# Patient Record
Sex: Male | Born: 1970
Health system: Southern US, Community
[De-identification: ages and names within clinical notes are randomized; demographics above are authoritative.]

## PROBLEM LIST (undated history)

## (undated) DIAGNOSIS — K219 Gastro-esophageal reflux disease without esophagitis: Secondary | ICD-10-CM

## (undated) DIAGNOSIS — M1990 Unspecified osteoarthritis, unspecified site: Secondary | ICD-10-CM

## (undated) DIAGNOSIS — E039 Hypothyroidism, unspecified: Secondary | ICD-10-CM

## (undated) HISTORY — DX: Gastro-esophageal reflux disease without esophagitis: K21.9

## (undated) HISTORY — PX: FOOT SURGERY: SHX648

---

## 2006-07-19 ENCOUNTER — Ambulatory Visit: Payer: Self-pay | Admitting: Gastroenterology

## 2006-07-19 LAB — CONVERTED CEMR LAB
Basophils Absolute: 0.1 10*3/uL (ref 0.0–0.1)
Basophils Relative: 0.7 % (ref 0.0–1.0)
Eosinophils Absolute: 0.2 10*3/uL (ref 0.0–0.6)
Eosinophils Relative: 2.4 % (ref 0.0–5.0)
HCT: 44.9 % (ref 39.0–52.0)
Hemoglobin: 15.5 g/dL (ref 13.0–17.0)
Lymphocytes Relative: 31.1 % (ref 12.0–46.0)
MCHC: 34.6 g/dL (ref 30.0–36.0)
MCV: 91.1 fL (ref 78.0–100.0)
Monocytes Absolute: 0.4 10*3/uL (ref 0.2–0.7)
Monocytes Relative: 5.5 % (ref 3.0–11.0)
Neutro Abs: 4.3 10*3/uL (ref 1.4–7.7)
Neutrophils Relative %: 60.3 % (ref 43.0–77.0)
Platelets: 211 10*3/uL (ref 150–400)
RBC: 4.93 M/uL (ref 4.22–5.81)
RDW: 12.3 % (ref 11.5–14.6)
WBC: 7.3 10*3/uL (ref 4.5–10.5)

## 2006-07-22 ENCOUNTER — Ambulatory Visit: Payer: Self-pay | Admitting: Gastroenterology

## 2015-01-10 ENCOUNTER — Telehealth: Payer: Self-pay

## 2015-01-10 NOTE — Telephone Encounter (Signed)
Incorrect chart

## 2016-02-24 ENCOUNTER — Other Ambulatory Visit: Payer: Self-pay | Admitting: Physical Medicine and Rehabilitation

## 2016-02-24 ENCOUNTER — Ambulatory Visit
Admission: RE | Admit: 2016-02-24 | Discharge: 2016-02-24 | Disposition: A | Discharge status: Home / Self Care | Payer: No Typology Code available for payment source | Source: Ambulatory Visit | Attending: Physical Medicine and Rehabilitation | Admitting: Physical Medicine and Rehabilitation

## 2016-02-24 DIAGNOSIS — Z0189 Encounter for other specified special examinations: Secondary | ICD-10-CM

## 2016-09-13 ENCOUNTER — Ambulatory Visit (INDEPENDENT_AMBULATORY_CARE_PROVIDER_SITE_OTHER): Payer: 59 | Admitting: Podiatry

## 2016-09-13 ENCOUNTER — Ambulatory Visit (INDEPENDENT_AMBULATORY_CARE_PROVIDER_SITE_OTHER): Payer: 59

## 2016-09-13 DIAGNOSIS — M722 Plantar fascial fibromatosis: Secondary | ICD-10-CM

## 2016-09-13 MED ORDER — MELOXICAM 15 MG PO TABS: 15.0000 mg | ORAL_TABLET | Freq: Every day | ORAL | 2 refills | Status: AC

## 2016-09-13 NOTE — Progress Notes (Signed)
Subjective:     Patient ID: Eduardo [name], male   DOB: [birth year], 46 y.o.   MRN: [id number]  HPI 46 year old male presents the also concerns of throbbing pain in the bottom of his right heel which is worse after sitting or sleeping or standing up. This has been ongoing for about 6 months. He said no recent treatment for this. He previously last summer had injections in the right foot for another issue without before the heel started to hurt. He's had no treatment for this particular issue. Pain does not wake him up at night. No numbness or tingling. No other complaints at this time.  Review of Systems  All other systems reviewed and are negative.      Objective:   Physical Exam General: AAO x3, NAD  Dermatological: Skin is warm, dry and supple bilateral. Nails x 10 are well manicured; remaining integument appears unremarkable at this time. There are no open sores, no preulcerative lesions, no rash or signs of infection present.  Vascular: Dorsalis Pedis artery and Posterior Tibial artery pedal pulses are 2/4 bilateral with immedate capillary fill time. Pedal hair growth present. There is no pain with calf compression, swelling, warmth, erythema.   Neruologic: Grossly intact via light touch bilateral. Vibratory intact via tuning fork bilateral. Protective threshold with Semmes Wienstein monofilament intact to all pedal sites bilateral.   Musculoskeletal: Tenderness to palpation along the plantar medial tubercle of the calcaneus at the insertion of plantar fascia on the right foot. There is no pain along the course of the plantar fascia within the arch of the foot. Plantar fascia appears to be intact. There is no pain with lateral compression of the calcaneus or pain with vibratory sensation. There is no pain along the course or insertion of the achilles tendon. No other areas of tenderness to bilateral lower extremities. Muscular strength 5/5 in all groups tested bilateral. Equinus is present.    Gait: Unassisted, Nonantalgic.      Assessment:     Right heel pain; plantar fasciitis     Plan:     -Treatment options discussed including all alternatives, risks, and complications -Etiology of symptoms were discussed -X-rays were obtained and reviewed with the patient. No evidence of acute fracture identified. -Patient elects to proceed with steroid injection into the right heel. Under sterile skin preparation, a total of 2.5cc of kenalog 10, 0.5% Marcaine plain, and 2% lidocaine plain were infiltrated into the symptomatic area without complication. A band-aid was applied. Patient tolerated the injection well without complication. Post-injection care with discussed with the patient. Discussed with the patient to ice the area over the next couple of days to help prevent a steroid flare.  -Night splint -Prescribed mobic. Discussed side effects of the medication and directed to stop if any are to occur and call the office.  -Stretching exercises daily. -Also recommended orthotics. He wishes to proceed with custom. Curiously had over-the-counter once for another issue which did not help and venous for her more. He was measured for orthotics and they were sent to [facility] labs. -RTC 3 weeks or sooner if needed.  Ovid Curd, DPM

## 2016-09-13 NOTE — Patient Instructions (Signed)
Plantar Fasciitis Rehab Ask your health care provider which exercises are safe for you. Do exercises exactly as told by your health care provider and adjust them as directed. It is normal to feel mild stretching, pulling, tightness, or discomfort as you do these exercises, but you should stop right away if you feel sudden pain or your pain gets worse. Do not begin these exercises until told by your health care provider. Stretching and range of motion exercises These exercises warm up your muscles and joints and improve the movement and flexibility of your foot. These exercises also help to relieve pain. Exercise A: Plantar fascia stretch 1. Sit with your left / right leg crossed over your opposite knee. 2. Hold your heel with one hand with that thumb near your arch. With your other hand, hold your toes and gently pull them back toward the top of your foot. You should feel a stretch on the bottom of your toes or your foot or both. 3. Hold this stretch for__________ seconds. 4. Slowly release your toes and return to the starting position. Repeat __________ times. Complete this exercise __________ times a day. Exercise B: Gastroc, standing 1. Stand with your hands against a wall. 2. Extend your left / right leg behind you, and bend your front knee slightly. 3. Keeping your heels on the floor and keeping your back knee straight, shift your weight toward the wall without arching your back. You should feel a gentle stretch in your left / right calf. 4. Hold this position for __________ seconds. Repeat __________ times. Complete this exercise __________ times a day. Exercise C: Soleus, standing 1. Stand with your hands against a wall. 2. Extend your left / right leg behind you, and bend your front knee slightly. 3. Keeping your heels on the floor, bend your back knee and slightly shift your weight over the back leg. You should feel a gentle stretch deep in your calf. 4. Hold this position for __________  seconds. Repeat __________ times. Complete this exercise __________ times a day. Exercise D: Gastrocsoleus, standing 1. Stand with the ball of your left / right foot on a step. The ball of your foot is on the walking surface, right under your toes. 2. Keep your other foot firmly on the same step. 3. Hold onto the wall or a railing for balance. 4. Slowly lift your other foot, allowing your body weight to press your heel down over the edge of the step. You should feel a stretch in your left / right calf. 5. Hold this position for __________ seconds. 6. Return both feet to the step. 7. Repeat this exercise with a slight bend in your left / right knee. Repeat __________ times with your left / right knee straight and __________ times with your left / right knee bent. Complete this exercise __________ times a day. Balance exercise This exercise builds your balance and strength control of your arch to help take pressure off your plantar fascia. Exercise E: Single leg stand 1. Without shoes, stand near a railing or in a doorway. You may hold onto the railing or door frame as needed. 2. Stand on your left / right foot. Keep your big toe down on the floor and try to keep your arch lifted. Do not let your foot roll inward. 3. Hold this position for __________ seconds. 4. If this exercise is too easy, you can try it with your eyes closed or while standing on a pillow. Repeat __________ times. Complete this exercise   __________ times a day. This information is not intended to replace advice given to you by your health care provider. Make sure you discuss any questions you have with your health care provider. Document Released: 05/14/2005 Document Revised: 01/17/2016 Document Reviewed: 03/28/2015 Elsevier Interactive Patient Education  2017 [employer]   Achilles Tendinitis Rehab Ask your health care provider which exercises are safe for you. Do exercises exactly as told by your health care provider and  adjust them as directed. It is normal to feel mild stretching, pulling, tightness, or discomfort as you do these exercises, but you should stop right away if you feel sudden pain or your pain gets worse. Do not begin these exercises until told by your health care provider. Stretching and range of motion exercises These exercises warm up your muscles and joints and improve the movement and flexibility of your ankle. These exercises also help to relieve pain, numbness, and tingling. Exercise A: Standing wall calf stretch, knee straight 5. Stand with your hands against a wall. 6. Extend your __________ leg behind you and bend your front knee slightly. Keep both of your heels on the floor. 7. Point the toes of your back foot slightly inward. 8. Keeping your heels on the floor and your back knee straight, shift your weight toward the wall. Do not allow your back to arch. You should feel a gentle stretch in your calf. 9. Hold this position for seconds. Repeat __________ times. Complete this stretch __________ times per day. Exercise B: Standing wall calf stretch, knee bent 5. Stand with your hands against a wall. 6. Extend your __________ leg behind you, and bend your front knee slightly. Keep both of your heels on the floor. 7. Point the toes of your back foot slightly inward. 8. Keeping your heels on the floor, unlock your back knee so that it is bent. You should feel a gentle stretch deep in your calf. 9. Hold this position for __________ seconds. Repeat __________ times. Complete this stretch __________ times per day. Strengthening exercises These exercises build strength and control of your ankle. Endurance is the ability to use your muscles for a long time, even after they get tired. Exercise C: Plantar flexion with band 5. Sit on the floor with your __________ leg extended. You may put a pillow under your calf to give your foot more room to move. 6. Loop a rubber exercise band or tube around  the ball of your __________ foot. The ball of your foot is on the walking surface, right under your toes. The band or tube should be slightly tense when your foot is relaxed. If the band or tube slips, you can put on your shoe or put a washcloth between the band and your foot to help it stay in place. 7. Slowly point your toes downward, pushing them away from you. 8. Hold this position for __________ seconds. 9. Slowly release the tension in the band or tube, controlling smoothly until your foot is back to the starting position. Repeat __________ times. Complete this exercise __________ times per day. Exercise D: Heel raise with eccentric lower 1. Stand on a step with the balls of your feet. The ball of your foot is on the walking surface, right under your toes.  Do not put your heels on the step.  For balance, rest your hands on the wall or on a railing. 2. Rise up onto the balls of your feet. 3. Keeping your heels up, shift all of your weight to   your __________ leg and pick up your other leg. 4. Slowly lower your __________ leg so your heel drops below the level of the step. 5. Put down your foot. If told by your health care provider, build up to:  3 sets of 15 repetitions while keeping your knees straight.  3 sets of 15 repetitions while keeping your knees bent as far as told by your health care provider. Complete this exercise __________ times per day. If this exercise is too easy, try doing it while wearing a backpack with weights in it. Balance exercises These exercises improve or maintain your balance. Balance is important in preventing falls. Exercise E: Single leg stand 1. Without shoes, stand near a railing or in a door frame. Hold on to the railing or door frame as needed. 2. Stand on your __________ foot. Keep your big toe down on the floor and try to keep your arch lifted. 3. Hold this position for __________ seconds. Repeat __________ times. Complete this exercise __________  times per day. If this exercise is too easy, you can try it with your eyes closed or while standing on a pillow. This information is not intended to replace advice given to you by your health care provider. Make sure you discuss any questions you have with your health care provider. Document Released: 12/13/2004 Document Revised: 01/19/2016 Document Reviewed: 01/18/2015 Elsevier Interactive Patient Education  2017 [employer]   

## 2016-10-04 ENCOUNTER — Ambulatory Visit (INDEPENDENT_AMBULATORY_CARE_PROVIDER_SITE_OTHER): Payer: 59 | Admitting: Podiatry

## 2016-10-04 ENCOUNTER — Encounter: Payer: Self-pay | Admitting: Podiatry

## 2016-10-04 DIAGNOSIS — M722 Plantar fascial fibromatosis: Secondary | ICD-10-CM

## 2016-10-04 MED ORDER — TRIAMCINOLONE ACETONIDE 10 MG/ML IJ SUSP
10.0000 mg | Freq: Once | INTRAMUSCULAR | Status: AC
  Administered 2016-10-04: 10 mg

## 2016-10-04 NOTE — Progress Notes (Signed)
Subjective: Arlice ColtJason W Moxey presents to the office today for follow-up evaluation of right heel pain. They state that they are doing better but still having pain, mostly at the end of the day after standing. They have been icing, stretching, try to wear supportive shoe as much as possible. No other complaints at this time. No acute changes since last appointment. They deny any systemic complaints such as fevers, chills, nausea, vomiting.  Objective: General: AAO x3, NAD  Dermatological: Skin is warm, dry and supple bilateral. There are no open sores, no preulcerative lesions, no rash or signs of infection present.  Vascular: Dorsalis Pedis artery and Posterior Tibial artery pedal pulses are 2/4 bilateral with immedate capillary fill time. Pedal hair growth present. There is no pain with calf compression, swelling, warmth, erythema.   Neruologic: Grossly intact via light touch bilateral. Negative tinel sign.   Musculoskeletal: There is improved but mild continued tenderness palpation along the plantar medial tubercle of the calcaneus at the insertion of the plantar fascia on the right foot. There is no pain along the course of the plantar fascia within the arch of the foot. Plantar fascia appears to be intact bilaterally. There is no pain with lateral compression of the calcaneus and there is no pain with vibratory sensation. There is no pain along the course or insertion of the Achilles tendon. There are no other areas of tenderness to bilateral lower extremities. No gross boney pedal deformities bilateral. No pain, crepitus, or limitation noted with foot and ankle range of motion bilateral. Muscular strength 5/5 in all groups tested bilateral.  Gait: Unassisted, Nonantalgic.   Assessment: Presents for follow-up evaluation for heel pain, likely plantar fasciitis   Plan: -Treatment options discussed including all alternatives, risks, and complications -Patient elects to proceed with steroid  injection into the right heel. Under sterile skin preparation, a total of 2.5cc of kenalog 10, 0.5% Marcaine plain, and 2% lidocaine plain were infiltrated into the symptomatic area without complication. A band-aid was applied. Patient tolerated the injection well without complication. Post-injection care with discussed with the patient. Discussed with the patient to ice the area over the next couple of days to help prevent a steroid flare.  -Orthotics dispensed today. Oral and written break-in instructions discussed.  -Ice and stretching exercises on a daily basis. -Continue supportive shoe gear. -Follow-up in 4 weeks or sooner if any problems arise. In the meantime, encouraged to call the office with any questions, concerns, change in symptoms.   Ovid CurdMatthew Josphine Laffey, DPM

## 2016-11-08 ENCOUNTER — Ambulatory Visit: Payer: 59 | Admitting: Podiatry

## 2017-07-06 ENCOUNTER — Encounter (HOSPITAL_COMMUNITY): Payer: Self-pay | Admitting: Emergency Medicine

## 2017-07-06 ENCOUNTER — Emergency Department (HOSPITAL_COMMUNITY)
Admission: EM | Admit: 2017-07-06 | Discharge: 2017-07-06 | Disposition: A | Discharge status: Home / Self Care | Payer: Worker's Compensation | Attending: Emergency Medicine | Admitting: Emergency Medicine

## 2017-07-06 DIAGNOSIS — Y990 Civilian activity done for income or pay: Secondary | ICD-10-CM | POA: Insufficient documentation

## 2017-07-06 DIAGNOSIS — Z23 Encounter for immunization: Secondary | ICD-10-CM | POA: Insufficient documentation

## 2017-07-06 DIAGNOSIS — Y929 Unspecified place or not applicable: Secondary | ICD-10-CM | POA: Insufficient documentation

## 2017-07-06 DIAGNOSIS — Y939 Activity, unspecified: Secondary | ICD-10-CM | POA: Insufficient documentation

## 2017-07-06 DIAGNOSIS — S61411A Laceration without foreign body of right hand, initial encounter: Secondary | ICD-10-CM | POA: Diagnosis present

## 2017-07-06 DIAGNOSIS — W268XXA Contact with other sharp object(s), not elsewhere classified, initial encounter: Secondary | ICD-10-CM | POA: Diagnosis not present

## 2017-07-06 MED ORDER — LIDOCAINE HCL (PF) 1 % IJ SOLN
30.0000 mL | Freq: Once | INTRAMUSCULAR | Status: AC
  Administered 2017-07-06: 30 mL
  Filled 2017-07-06: qty 30

## 2017-07-06 MED ORDER — TETANUS-DIPHTH-ACELL PERTUSSIS 5-2.5-18.5 LF-MCG/0.5 IM SUSP
0.5000 mL | Freq: Once | INTRAMUSCULAR | Status: AC
  Administered 2017-07-06: 0.5 mL via INTRAMUSCULAR
  Filled 2017-07-06: qty 0.5

## 2017-07-06 NOTE — ED Notes (Signed)
Has 1.5" laceration to right palm. Pt washed hand at the sink. Bleeding controlled. Suture cart at bedside.

## 2017-07-06 NOTE — ED Provider Notes (Signed)
[facility] EMERGENCY DEPARTMENT Provider Note   CSN: [id number] Arrival date & time: 07/06/17  0455     History   Chief Complaint Chief Complaint  Patient presents with  . Extremity Laceration    HPI Eduardo [name] is a 47 y.o. male.  The history is provided by the patient and medical records. No language interpreter was used.   Eduardo [name] is an otherwise healthy 47 y.o. male who is to the emergency department complaining of laceration to the right palm which occurred about 4 AM this morning.  Patient reports that he cut his hand on a piece of metal while at work.  He controlled bleeding with pressure and washed the area.  No medications taken prior to arrival.  No numbness, tingling or weakness.  No difficulty moving the hand.  Unsure of tetanus status.  History reviewed. No pertinent past medical history.  There are no active problems to display for this patient.   History reviewed. No pertinent surgical history.     Home Medications    Prior to Admission medications   Medication Sig Start Date End Date Taking? Authorizing Provider  meloxicam (MOBIC) 15 MG tablet Take 1 tablet (15 mg total) by mouth daily. 09/13/16 09/13/17  Vivi Barrack, DPM    Family History No family history on file.  Social History Social History   Tobacco Use  . Smoking status: Never Smoker  . Smokeless tobacco: Never Used  Substance Use Topics  . Alcohol use: Yes    Comment: occasionally  . Drug use: No     Allergies   Patient has no known allergies.   Review of Systems Review of Systems  Skin: Positive for wound.  Neurological: Negative for weakness and numbness.     Physical Exam Updated Vital Signs BP (!) 171/92 (BP Location: Left Arm)   Pulse 81   Temp 98.2 F (36.8 C) (Oral)   Resp 18   Ht 5\' 10"  (1.778 m)   Wt 108.9 kg (240 lb)   SpO2 96%   BMI 34.44 kg/m   Physical Exam  Constitutional: He appears well-developed and well-nourished.  No distress.  HENT:  Head: Normocephalic and atraumatic.  Neck: Neck supple.  Cardiovascular: Normal rate, regular rhythm and normal heart sounds.  No murmur heard. Pulmonary/Chest: Effort normal and breath sounds normal. No respiratory distress. He has no wheezes. He has no rales.  Musculoskeletal: Normal range of motion.  Right hand with full range of motion and 5/5 grip strength.  2+ radial pulse.  Sensation intact to radial, ulnar and median nerve distributions.  Good cap refill.  Neurological: He is alert.  Skin: Skin is warm and dry.  4 cm laceration to right palm.  Nursing note and vitals reviewed.    ED Treatments / Results  Labs (all labs ordered are listed, but only abnormal results are displayed) Labs Reviewed - No data to display  EKG  EKG Interpretation None       Radiology No results found.  Procedures .Marland KitchenLaceration Repair Date/Time: 07/06/2017 8:15 AM Performed by: Melondy Blanchard, Chase Picket, PA-C Authorized by: Longino Trefz, Chase Picket, PA-C   Consent:    Consent obtained:  Verbal   Consent given by:  Patient   Risks discussed:  Pain, infection, poor cosmetic result and poor wound healing Anesthesia (see MAR for exact dosages):    Anesthesia method:  Local infiltration   Local anesthetic:  Lidocaine 1% w/o epi Laceration details:    Location:  Hand   Hand location:  R palm   Length (cm):  4 Repair type:    Repair type:  Simple Pre-procedure details:    Preparation:  Patient was prepped and draped in usual sterile fashion Exploration:    Hemostasis achieved with:  Direct pressure   Wound exploration: wound explored through full range of motion and entire depth of wound probed and visualized   Treatment:    Area cleansed with:  Saline   Amount of cleaning:  Standard   Irrigation solution:  Sterile saline Skin repair:    Repair method:  Sutures   Suture size:  5-0   Wound skin closure material used: Vicryl Rapide.   Suture technique:  Simple interrupted    Number of sutures:  7 Approximation:    Approximation:  Close Post-procedure details:    Patient tolerance of procedure:  Tolerated well, no immediate complications   (including critical care time)  Medications Ordered in ED Medications  lidocaine (PF) (XYLOCAINE) 1 % injection 30 mL (30 mLs Infiltration Given 07/06/17 0752)  Tdap (BOOSTRIX) injection 0.5 mL (0.5 mLs Intramuscular Given 07/06/17 0752)     Initial Impression / Assessment and Plan / ED Course  I have reviewed the triage vital signs and the nursing notes.  Pertinent labs & imaging results that were available during my care of the patient were reviewed by me and considered in my medical decision making (see chart for details).    Eduardo [name] is a 47 y.o. male who presents to ED for laceration of right hand. Wound thoroughly cleaned in ED today. Wound explored and bottom of wound seen in a bloodless field. Laceration repaired as dictated above. Patient counseled on home wound care. Patient was urged to return to the Emergency Department for worsening pain, swelling, expanding erythema especially if it streaks away from the affected area, fever, or for any additional concerns. Patient verbalized understanding. All questions answered.   Final Clinical Impressions(s) / ED Diagnoses   Final diagnoses:  Laceration of right hand without foreign body, initial encounter    ED Discharge Orders    None       Symir Mah, Chase PicketJaime Pilcher, PA-C 07/06/17 [id number]    Maia PlanLong, Joshua G, MD 07/06/17 [phone]

## 2017-07-06 NOTE — ED Triage Notes (Signed)
Reports cutting hand on piece of metal at work this morning across palm of right hand.  Dressing in place with bleeding controlled at this time.

## 2017-07-06 NOTE — Discharge Instructions (Signed)
It was my pleasure taking care of you today!   Keep wound clean with mild soap and water. Keep area covered with a topical antibiotic ointment and bandage, keep bandage dry, and do not submerge in water for 24 hours. Ice and elevate for additional pain relief and swelling. Alternate between ibuprofen and Tylenol for additional pain relief. Your stiches are dissolvable. If they have not come out in 10 days, you can scrub the area with Vaseline and warm wash cloth to remove them. Monitor area for signs of infection to include, but not limited to: increasing pain, spreading redness, drainage/pus, worsening swelling, or fevers. Return to emergency department for emergent changing or worsening symptoms.   WOUND CARE Keep area clean and dry for 24 hours. Do not remove bandage, if applied. After 24 hours,you should change it at least once a day. Also, change the dressing if it becomes wet or dirty, or as directed by your caregiver.  Wash the wound with soap and water 2 times a day. Rinse the wound off with water to remove all soap. Pat the wound dry with a clean towel.  You may shower as usual after the first 24 hours. Do not soak the wound in water until the sutures are removed.  Return if you experience any of the following signs of infection: Swelling, redness, pus drainage, streaking, fever >101.0 F Return if you experience excessive bleeding that does not stop after 15-20 minutes of constant, firm pressure.

## 2017-07-28 NOTE — Progress Notes (Signed)
Subjective: EG:BTDVVOHYW care, "knot on testicles" HPI: Eduardo [name] is a 47 y.o. male presenting to clinic today for:  1. Testicular concern Patient reports that he felt a knot at the top of his right testicle about 1 year ago.  Is not this really grown in size but has persisted.  He notes that he occasionally will have an ache in the right testicle.  He also reports that the ache seems to be more prominent after having intercourse.  He denies any swelling, penile or testicular lesions, penile discharge, nausea, vomiting, unplanned weight loss, fevers, chills.  He does note intermittent low back pain but he thinks that this may be related to his work.   Past Medical History:  Diagnosis Date  . GERD (gastroesophageal reflux disease)    Past Surgical History:  Procedure Laterality Date  . FOOT SURGERY Right    had accessory bone removed.   Social History   Socioeconomic History  . Marital status: Married    Spouse name: Not on file  . Number of children: 0  . Years of education: Not on file  . Highest education level: Not on file  Social Needs  . Financial resource strain: Not on file  . Food insecurity - worry: Not on file  . Food insecurity - inability: Not on file  . Transportation needs - medical: Not on file  . Transportation needs - non-medical: Not on file  Occupational History  . Not on file  Tobacco Use  . Smoking status: Former Research scientist (life sciences)  . Smokeless tobacco: Never Used  Substance and Sexual Activity  . Alcohol use: Yes    Comment: occasionally  . Drug use: No  . Sexual activity: Yes  Other Topics Concern  . Not on file  Social History Narrative  . Not on file   Current Meds  Medication Sig  . meloxicam (MOBIC) 15 MG tablet Take 1 tablet (15 mg total) by mouth daily.   Family History  Adopted: Yes   No Known Allergies    ROS: Per HPI  Objective: Office vital signs reviewed. BP 135/82   Pulse 81   Temp (!) 97 F (36.1 C) (Oral)   Ht 5' 10"   (1.778 m)   Wt 253 lb (114.8 kg)   BMI 36.30 kg/m    Physical Examination:  General: Awake, alert, overweight, No acute distress HEENT: Normal    Neck: No masses palpated. No lymphadenopathy; thyroid not palpable.    Eyes: PERRLA, extraocular movement in tact, sclera white; no exophthalmos    Throat: moist mucus membranes, no erythema, poor dentition noted. Cardio: regular rate and rhythm, S1S2 heard, no murmurs appreciated Pulm: clear to auscultation bilaterally, no wheezes, rhonchi or rales; normal work of breathing on room air GU: No penile or testicular lesions appreciated on visual inspection.  No penile discharge.  No testicular swelling or erythema.  There is a small palpable nodule at the apex of the testes bilaterally that is smaller than BB.  The right seems more prominent than the left.  These are minimally tender to palpation. MSK: normal gait and normal station Skin: Mildly erythematous but blanching, scaly rash appreciated on the lower abdomen and bilateral low back.  This is nontender to palpation.  Nonfluctuant, nonindurated, nonbleeding.  No exudate.  No vesicles.  Assessment/ Plan: 47 y.o. male   1. Testicular mass Given the location and character, I highly suspect that this is a spermatocele.  However, the lesion has been persistent for greater than  1 year so we will further evaluate since it is intermittently symptomatic.  Testicular ultrasound ordered.  Will check CBC.  Check for STDs.  Handout provided. - CBC with Differential - US SCROTUM W/DOPPLER; Future  2. Screening for STD (sexually transmitted disease) - Chlamydia/Gonococcus/Trichomonas, NAA - HIV antibody  3. Encounter to establish care with new doctor Previously seen by Particia Nearing.  4. Elevated blood-pressure reading without diagnosis of hypertension Improved with recheck.  We will continue to monitor. - CMP14+EGFR  5. Screening for thyroid disorder - TSH  6. Screening for prostate cancer PSA  screening reviewed with the patient.  He voiced good understanding and acknowledges that that are sometimes false positives that may result in further unnecessary testing.  Because he has no knowledge of his family history, he would like to proceed with screening. - PSA  7. Screening for lipid disorders - Lipid Panel  8. BMI 36.0-36.9,adult - CMP14+EGFR - TSH - Lipid Panel  9. Tinea versicolor Clinically consistent with a fungal infection.  Will treat with ketoconazole shampoo applied daily for the next week.  Instructions for use reviewed with patient.  Follow-up as needed.  If symptoms are persistent, will refer to dermatology.  Orders Placed This Encounter  Procedures  . Chlamydia/Gonococcus/Trichomonas, NAA  . US SCROTUM W/DOPPLER  . CMP14+EGFR  . CBC with Differential  . TSH  . HIV antibody  . PSA  . Lipid Panel   Meds ordered this encounter  Medications  . ketoconazole (NIZORAL) 2 % shampoo    Sig: Wash affected areas on stomach and back.  Leave on for 5 minutes then rinse off once daily for 7 days.    Dispense:  120 mL    Refill:  Wilson, DO [place] [phone]

## 2017-07-29 ENCOUNTER — Other Ambulatory Visit: Payer: Self-pay

## 2017-07-29 ENCOUNTER — Ambulatory Visit: Payer: 59 | Admitting: Family Medicine

## 2017-07-29 ENCOUNTER — Encounter: Payer: Self-pay | Admitting: Family Medicine

## 2017-07-29 VITALS — BP 135/82 | HR 81 | Temp 97.0°F | Ht 70.0 in | Wt 253.0 lb

## 2017-07-29 DIAGNOSIS — N509 Disorder of male genital organs, unspecified: Secondary | ICD-10-CM

## 2017-07-29 DIAGNOSIS — B360 Pityriasis versicolor: Secondary | ICD-10-CM

## 2017-07-29 DIAGNOSIS — Z13220 Encounter for screening for lipoid disorders: Secondary | ICD-10-CM | POA: Diagnosis not present

## 2017-07-29 DIAGNOSIS — Z1329 Encounter for screening for other suspected endocrine disorder: Secondary | ICD-10-CM | POA: Diagnosis not present

## 2017-07-29 DIAGNOSIS — Z113 Encounter for screening for infections with a predominantly sexual mode of transmission: Secondary | ICD-10-CM | POA: Diagnosis not present

## 2017-07-29 DIAGNOSIS — Z6836 Body mass index (BMI) 36.0-36.9, adult: Secondary | ICD-10-CM | POA: Diagnosis not present

## 2017-07-29 DIAGNOSIS — M722 Plantar fascial fibromatosis: Secondary | ICD-10-CM | POA: Diagnosis not present

## 2017-07-29 DIAGNOSIS — Z125 Encounter for screening for malignant neoplasm of prostate: Secondary | ICD-10-CM

## 2017-07-29 DIAGNOSIS — Z7689 Persons encountering health services in other specified circumstances: Secondary | ICD-10-CM | POA: Diagnosis not present

## 2017-07-29 DIAGNOSIS — R030 Elevated blood-pressure reading, without diagnosis of hypertension: Secondary | ICD-10-CM

## 2017-07-29 DIAGNOSIS — N50819 Testicular pain, unspecified: Secondary | ICD-10-CM

## 2017-07-29 DIAGNOSIS — Z6835 Body mass index (BMI) 35.0-35.9, adult: Secondary | ICD-10-CM | POA: Insufficient documentation

## 2017-07-29 DIAGNOSIS — N5089 Other specified disorders of the male genital organs: Secondary | ICD-10-CM

## 2017-07-29 MED ORDER — KETOCONAZOLE 2 % EX SHAM: MEDICATED_SHAMPOO | CUTANEOUS | 0 refills | Status: DC

## 2017-07-29 NOTE — Patient Instructions (Signed)
I think the lesion that we are palpating is a likely benign spermatocele.  I have ordered an ultrasound of your testes to further evaluate.  You had labs performed today.  You will be contacted with the results of the labs once they are available, usually in the next 3 days for routine lab work.  Spermatocele A spermatocele is a fluid-filled sac (cyst) inside the scrotum. This type of cyst often forms at the top of the testicle where sperm is stored (epididymis). The cyst sometimes forms along the tube that carries sperm away from the epididymis (vas deferens). Spermatoceles are usually painless. Most cysts are small, but they can grow larger. Spermatoceles are not cancerous (are benign). What are the causes? The cause of this condition is not known. What are the signs or symptoms? In most cases, small cysts do not cause symptoms. If you do have symptoms, they may include:  Dull pain.  A feeling of heaviness.  An enlargement of your scrotum, if the cyst is large.  How is this diagnosed? This condition is diagnosed based on a physical exam. You or your health care provider may notice the cyst when feeling your scrotum. Your provider may shine a light through (transilluminate) your scrotum to see if light will pass through the cyst. You may also have an ultrasound of your scrotum to rule out a tumor. How is this treated? Small spermatoceles do not need to be treated. If the spermatocele has grown large or is uncomfortable, surgery to remove the cyst may be recommended. Follow these instructions at home:  Watch your spermatocele for any changes.  Keep all follow-up visits as told by your health care provider. This is important. Contact a health care provider if:  Your spermatocele gets larger.  You have pain in your scrotum.  Your spermatocele comes back after treatment. This information is not intended to replace advice given to you by your health care provider. Make sure you discuss  any questions you have with your health care provider. Document Released: 09/05/2015 Document Revised: 01/09/2016 Document Reviewed: 05/29/2015 Elsevier Interactive Patient Education  [employer]2018 Elsevier Inc.

## 2017-07-30 LAB — CBC WITH DIFFERENTIAL/PLATELET
BASOS ABS: 0 10*3/uL (ref 0.0–0.2)
Basos: 1 %
EOS (ABSOLUTE): 0.1 10*3/uL (ref 0.0–0.4)
Eos: 1 %
Hematocrit: 45.2 % (ref 37.5–51.0)
Hemoglobin: 14.5 g/dL (ref 13.0–17.7)
IMMATURE GRANULOCYTES: 0 %
Immature Grans (Abs): 0 10*3/uL (ref 0.0–0.1)
LYMPHS ABS: 2.7 10*3/uL (ref 0.7–3.1)
Lymphs: 32 %
MCH: 29.5 pg (ref 26.6–33.0)
MCHC: 32.1 g/dL (ref 31.5–35.7)
MCV: 92 fL (ref 79–97)
MONOS ABS: 0.6 10*3/uL (ref 0.1–0.9)
Monocytes: 7 %
NEUTROS PCT: 59 %
Neutrophils Absolute: 4.8 10*3/uL (ref 1.4–7.0)
PLATELETS: 243 10*3/uL (ref 150–379)
RBC: 4.91 x10E6/uL (ref 4.14–5.80)
RDW: 14 % (ref 12.3–15.4)
WBC: 8.3 10*3/uL (ref 3.4–10.8)

## 2017-07-30 LAB — CMP14+EGFR
ALBUMIN: 4 g/dL (ref 3.5–5.5)
ALK PHOS: 96 IU/L (ref 39–117)
ALT: 35 IU/L (ref 0–44)
AST: 20 IU/L (ref 0–40)
Albumin/Globulin Ratio: 1.4 (ref 1.2–2.2)
BILIRUBIN TOTAL: 0.2 mg/dL (ref 0.0–1.2)
BUN/Creatinine Ratio: 10 (ref 9–20)
BUN: 11 mg/dL (ref 6–24)
CHLORIDE: 104 mmol/L (ref 96–106)
CO2: 23 mmol/L (ref 20–29)
CREATININE: 1.13 mg/dL (ref 0.76–1.27)
Calcium: 9.1 mg/dL (ref 8.7–10.2)
GFR calc Af Amer: 90 mL/min/{1.73_m2} (ref >59)
GFR calc non Af Amer: 78 mL/min/{1.73_m2} (ref >59)
GLOBULIN, TOTAL: 2.9 g/dL (ref 1.5–4.5)
GLUCOSE: 90 mg/dL (ref 65–99)
Potassium: 4.3 mmol/L (ref 3.5–5.2)
SODIUM: 143 mmol/L (ref 134–144)
Total Protein: 6.9 g/dL (ref 6.0–8.5)

## 2017-07-30 LAB — LIPID PANEL
Chol/HDL Ratio: 6.8 ratio — ABNORMAL HIGH (ref 0.0–5.0)
Cholesterol, Total: 239 mg/dL — ABNORMAL HIGH (ref 100–199)
HDL: 35 mg/dL — ABNORMAL LOW (ref >39)
LDL Calculated: 141 mg/dL — ABNORMAL HIGH (ref 0–99)
Triglycerides: 317 mg/dL — ABNORMAL HIGH (ref 0–149)
VLDL Cholesterol Cal: 63 mg/dL — ABNORMAL HIGH (ref 5–40)

## 2017-07-30 LAB — TSH: TSH: 6.86 u[IU]/mL — AB (ref 0.450–4.500)

## 2017-07-30 LAB — CHLAMYDIA/GONOCOCCUS/TRICHOMONAS, NAA
CHLAMYDIA BY NAA: NEGATIVE
GONOCOCCUS BY NAA: NEGATIVE
Trich vag by NAA: NEGATIVE

## 2017-07-30 LAB — HIV ANTIBODY (ROUTINE TESTING W REFLEX): HIV SCREEN 4TH GENERATION: NONREACTIVE

## 2017-07-30 LAB — PSA: PROSTATE SPECIFIC AG, SERUM: 0.5 ng/mL (ref 0.0–4.0)

## 2017-07-31 ENCOUNTER — Ambulatory Visit (HOSPITAL_COMMUNITY)
Admission: RE | Admit: 2017-07-31 | Discharge: 2017-07-31 | Disposition: A | Discharge status: Home / Self Care | Payer: 59 | Source: Ambulatory Visit | Attending: Family Medicine | Admitting: Family Medicine

## 2017-07-31 ENCOUNTER — Other Ambulatory Visit: Payer: Self-pay | Admitting: Family Medicine

## 2017-07-31 DIAGNOSIS — N509 Disorder of male genital organs, unspecified: Secondary | ICD-10-CM | POA: Diagnosis not present

## 2017-07-31 DIAGNOSIS — N433 Hydrocele, unspecified: Secondary | ICD-10-CM | POA: Diagnosis not present

## 2017-07-31 DIAGNOSIS — N503 Cyst of epididymis: Secondary | ICD-10-CM | POA: Diagnosis not present

## 2017-07-31 DIAGNOSIS — N5089 Other specified disorders of the male genital organs: Secondary | ICD-10-CM

## 2017-07-31 LAB — SPECIMEN STATUS REPORT

## 2017-07-31 LAB — T3: T3 TOTAL: 102 ng/dL (ref 71–180)

## 2017-07-31 LAB — T4, FREE: FREE T4: 1.02 ng/dL (ref 0.82–1.77)

## 2017-07-31 LAB — HGB A1C W/O EAG: HEMOGLOBIN A1C: 5.8 % — AB (ref 4.8–5.6)

## 2017-07-31 MED ORDER — LEVOTHYROXINE SODIUM 75 MCG PO TABS: 75.0000 ug | ORAL_TABLET | Freq: Every day | ORAL | 1 refills | Status: DC

## 2017-07-31 NOTE — Progress Notes (Signed)
Patient aware and verbalized understanding appt made. °

## 2017-07-31 NOTE — Progress Notes (Signed)
lmtcb

## 2017-07-31 NOTE — Progress Notes (Signed)
Synthroid 75 mcg sent to pharmacy.  Please make sure the patient takes 1 tablet every morning with only a glass of water and away from other medications and foods by at least 1 hour.  We will plan to see each other again in 6 weeks for recheck of thyroid.  Meds ordered this encounter  Medications  . levothyroxine (SYNTHROID, LEVOTHROID) 75 MCG tablet    Sig: Take 1 tablet (75 mcg total) by mouth daily.    Dispense:  30 tablet    Refill:  1   Eduardo [name] M. Nadine CountsGottschalk, DO [facility]

## 2017-08-06 ENCOUNTER — Ambulatory Visit: Payer: 59 | Admitting: Physician Assistant

## 2017-09-03 DIAGNOSIS — E785 Hyperlipidemia, unspecified: Secondary | ICD-10-CM | POA: Insufficient documentation

## 2017-09-03 DIAGNOSIS — N503 Cyst of epididymis: Secondary | ICD-10-CM | POA: Insufficient documentation

## 2017-09-03 NOTE — Progress Notes (Signed)
Subjective: Eduardo [name] TSH PCP: Raliegh Ip, DO FAO:ZHYQM W [name] is a 47 y.o. male presenting to clinic today for:  1. Elevated TSH/sleep difficulties Patient was noted to have a TSH of 6.860.  During that visit he noted decreased energy but was otherwise asymptomatic.  Free T4 and T3 were tested which were inappropriately normal given elevated TSH.  He was started on Synthroid 75 mcg daily, which was about 50 mcg less than his weight-based dose.  He notes that initially, the medication gave him diarrhea and muscle aches but this resolved after a few days.  He notes good energy now.  Denies diarrhea, constipation, hair thinning, skin changes, heart palpitations, visual disturbance or lower extremity edema.  He does note some difficulty with sleep but this is been a chronic issue for him.  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  He has been using Z quill to help with this.  He has lost a few pounds since starting the medication.  No history of radiation to the neck.  No surgery to the neck.  No preceding illness to last TSH check.  Patient was not on any medications.  Past medical history negative for thyroid disorder.  Family history unknown, patient is adopted.  2.  Hyperlipidemia/ Pre-DM Patient reports that he discussed recent labs with his wife and they do wish to start cholesterol medications, given his unknown family history.  He was adopted.  He notes that he has been working on improving his diet some.  No abdominal pain or jaundice.  No chest pain, shortness of breath, dizziness, lower extremity edema.   ROS: Per HPI  No Known Allergies Past Medical History:  Diagnosis Date  . GERD (gastroesophageal reflux disease)     Current Outpatient Medications:  .  ketoconazole (NIZORAL) 2 % shampoo, Wash affected areas on stomach and back.  Leave on for 5 minutes then rinse off once daily for 7 days., Disp: 120 mL, Rfl: 0 .   levothyroxine (SYNTHROID, LEVOTHROID) 75 MCG tablet, Take 1 tablet (75 mcg total) by mouth daily., Disp: 30 tablet, Rfl: 1 .  meloxicam (MOBIC) 15 MG tablet, Take 1 tablet (15 mg total) by mouth daily., Disp: 30 tablet, Rfl: 2 Social History   Socioeconomic History  . Marital status: Married    Spouse name: Not on file  . Number of children: 0  . Years of education: Not on file  . Highest education level: Not on file  Occupational History  . Not on file  Social Needs  . Financial resource strain: Not on file  . Food insecurity:    Worry: Not on file    Inability: Not on file  . Transportation needs:    Medical: Not on file    Non-medical: Not on file  Tobacco Use  . Smoking status: Former Games developer  . Smokeless tobacco: Never Used  Substance and Sexual Activity  . Alcohol use: Yes    Comment: occasionally  . Drug use: No  . Sexual activity: Yes  Lifestyle  . Physical activity:    Days per week: Not on file    Minutes per session: Not on file  . Stress: Not on file  Relationships  . Social connections:    Talks on phone: Not on file    Gets together: Not on file    Attends religious service: Not on file    Active member of club or organization:  Not on file    Attends meetings of clubs or organizations: Not on file    Relationship status: Not on file  . Intimate partner violence:    Fear of current or ex partner: Not on file    Emotionally abused: Not on file    Physically abused: Not on file    Forced sexual activity: Not on file  Other Topics Concern  . Not on file  Social History Narrative  . Not on file   Family History  Adopted: Yes    Objective: Office vital signs reviewed. BP 124/82   Pulse 69   Temp (!) 97.2 F (36.2 C) (Oral)   Ht 5\' 10"  (1.778 m)   Wt 249 lb (112.9 kg)   BMI 35.73 kg/m   Physical Examination:  General: Awake, alert, obese, No acute distress HEENT: Normal    Neck: No masses palpated. No lymphadenopathy; thyroid not enlarged.     Eyes: PERRLA, extraocular membranes intact, sclera white, eyes are prominent but no overt exophthalmos.    Throat: moist mucus membranes Cardio: regular rate and rhythm, S1S2 heard, no murmurs appreciated Pulm: clear to auscultation bilaterally, no wheezes, rhonchi or rales; normal work of breathing on room air Extremities: warm, well perfused, No edema, cyanosis or clubbing; +2 pulses bilaterally Skin: dry; intact; no rashes or lesions; temperature normal. Neuro: follows all commands, no focal deficits.  patellar DTRs 2/4  Assessment/ Plan: 47 y.o. male   Elevated TSH March 2019 TSH elevated with inappropriately normal free T4 T3.  He was started on Synthroid 75 mcg at last appointment.  He is tolerating medication well.  Recheck TSH today.  Will contact patient with results and adjust Synthroid dose as needed.  Hyperlipidemia ASCVD risk score of 5.1%.  He is borderline.  Given his unknown family history, patient does wish to proceed with statin therapy and continue with lifestyle modification.  Because he is borderline and prediabetic, we discussed that we will start pravastatin 40 mg.  We will plan to check direct LDL in 3 months.  Pre-diabetes Hemoglobin A1c was 5.8.  Patient is working on lifestyle modification.  Handout provided today with ways to improve health.  Shift work sleep disorder Per conversation today it seems that it is most an issue on the days that he is switching between day and night.  We discussed various therapies, including Ambien, trazodone and hydroxyzine.  Patient does not wish to have a medication like Ambien.  Will trial on hydroxyzine 10-20 mg nightly as needed sleep.  He will follow-up in 3 months or sooner if needed.   Orders Placed This Encounter  Procedures  . TSH   Meds ordered this encounter  Medications  . pravastatin (PRAVACHOL) 40 MG tablet    Sig: Take 1 tablet (40 mg total) by mouth daily.    Dispense:  90 tablet    Refill:  3  .  hydrOXYzine (ATARAX/VISTARIL) 10 MG tablet    Sig: Take 1-2 tablets (10-20 mg total) by mouth at bedtime as needed (sleep).    Dispense:  30 tablet    Refill:  0     Bertha Lokken Hulen SkainsM Thatcher Doberstein, DO [facility] [phone]

## 2017-09-04 ENCOUNTER — Encounter: Payer: Self-pay | Admitting: Family Medicine

## 2017-09-04 ENCOUNTER — Ambulatory Visit (INDEPENDENT_AMBULATORY_CARE_PROVIDER_SITE_OTHER): Payer: 59 | Admitting: Family Medicine

## 2017-09-04 VITALS — BP 124/82 | HR 69 | Temp 97.2°F | Ht 70.0 in | Wt 249.0 lb

## 2017-09-04 DIAGNOSIS — R7989 Other specified abnormal findings of blood chemistry: Secondary | ICD-10-CM | POA: Diagnosis not present

## 2017-09-04 DIAGNOSIS — G4726 Circadian rhythm sleep disorder, shift work type: Secondary | ICD-10-CM

## 2017-09-04 DIAGNOSIS — R7303 Prediabetes: Secondary | ICD-10-CM | POA: Diagnosis not present

## 2017-09-04 DIAGNOSIS — E782 Mixed hyperlipidemia: Secondary | ICD-10-CM

## 2017-09-04 DIAGNOSIS — E039 Hypothyroidism, unspecified: Secondary | ICD-10-CM | POA: Insufficient documentation

## 2017-09-04 MED ORDER — HYDROXYZINE HCL 10 MG PO TABS: 10.0000 mg | ORAL_TABLET | Freq: Every evening | ORAL | 0 refills | Status: DC | PRN

## 2017-09-04 MED ORDER — PRAVASTATIN SODIUM 40 MG PO TABS: 40.0000 mg | ORAL_TABLET | Freq: Every day | ORAL | 3 refills | Status: DC

## 2017-09-04 NOTE — Assessment & Plan Note (Signed)
Hemoglobin A1c was 5.8.  Patient is working on lifestyle modification.  Handout provided today with ways to improve health.

## 2017-09-04 NOTE — Assessment & Plan Note (Signed)
Per conversation today it seems that it is most an issue on the days that he is switching between day and night.  We discussed various therapies, including Ambien, trazodone and hydroxyzine.  Patient does not wish to have a medication like Ambien.  Will trial on hydroxyzine 10-20 mg nightly as needed sleep.  He will follow-up in 3 months or sooner if needed.

## 2017-09-04 NOTE — Assessment & Plan Note (Signed)
March 2019 TSH elevated with inappropriately normal free T4 T3.  He was started on Synthroid 75 mcg at last appointment.  He is tolerating medication well.  Recheck TSH today.  Will contact patient with results and adjust Synthroid dose as needed.

## 2017-09-04 NOTE — Patient Instructions (Addendum)
You had labs performed today.  You will be contacted with the results of the labs once they are available, usually in the next 3 days for routine lab work.  I have sent in Hydroxyzine to use for sleep and Pravastatin for cholesterol.  Hypothyroidism Hypothyroidism is a disorder of the thyroid. The thyroid is a large gland that is located in the lower front of the neck. The thyroid releases hormones that control how the body works. With hypothyroidism, the thyroid does not make enough of these hormones. What are the causes? Causes of hypothyroidism may include:  Viral infections.  Pregnancy.  Your own defense system (immune system) attacking your thyroid.  Certain medicines.  Birth defects.  Past radiation treatments to your head or neck.  Past treatment with radioactive iodine.  Past surgical removal of part or all of your thyroid.  Problems with the gland that is located in the center of your brain (pituitary).  What are the signs or symptoms? Signs and symptoms of hypothyroidism may include:  Feeling as though you have no energy (lethargy).  Inability to tolerate cold.  Weight gain that is not explained by a change in diet or exercise habits.  Dry skin.  Coarse hair.  Menstrual irregularity.  Slowing of thought processes.  Constipation.  Sadness or depression.  How is this diagnosed? Your health care provider may diagnose hypothyroidism with blood tests and ultrasound tests. How is this treated? Hypothyroidism is treated with medicine that replaces the hormones that your body does not make. After you begin treatment, it may take several weeks for symptoms to go away. Follow these instructions at home:  Take medicines only as directed by your health care provider.  If you start taking any new medicines, tell your health care provider.  Keep all follow-up visits as directed by your health care provider. This is important. As your condition improves, your  dosage needs may change. You will need to have blood tests regularly so that your health care provider can watch your condition. Contact a health care provider if:  Your symptoms do not get better with treatment.  You are taking thyroid replacement medicine and: ? You sweat excessively. ? You have tremors. ? You feel anxious. ? You lose weight rapidly. ? You cannot tolerate heat. ? You have emotional swings. ? You have diarrhea. ? You feel weak. Get help right away if:  You develop chest pain.  You develop an irregular heartbeat.  You develop a rapid heartbeat. This information is not intended to replace advice given to you by your health care provider. Make sure you discuss any questions you have with your health care provider. Document Released: 05/14/2005 Document Revised: 10/20/2015 Document Reviewed: 09/29/2013 Elsevier Interactive Patient Education  2018 [employer]

## 2017-09-04 NOTE — Assessment & Plan Note (Signed)
ASCVD risk score of 5.1%.  He is borderline.  Given his unknown family history, patient does wish to proceed with statin therapy and continue with lifestyle modification.  Because he is borderline and prediabetic, we discussed that we will start pravastatin 40 mg.  We will plan to check direct LDL in 3 months.

## 2017-09-05 LAB — TSH: TSH: 3.08 u[IU]/mL (ref 0.450–4.500)

## 2017-09-27 ENCOUNTER — Other Ambulatory Visit: Payer: Self-pay | Admitting: *Deleted

## 2017-09-27 ENCOUNTER — Encounter: Payer: Self-pay | Admitting: Family Medicine

## 2017-09-27 MED ORDER — LEVOTHYROXINE SODIUM 75 MCG PO TABS: 75.0000 ug | ORAL_TABLET | Freq: Every day | ORAL | 0 refills | Status: DC

## 2017-09-30 ENCOUNTER — Encounter: Payer: Self-pay | Admitting: Family Medicine

## 2017-10-01 DIAGNOSIS — S60011A Contusion of right thumb without damage to nail, initial encounter: Secondary | ICD-10-CM | POA: Diagnosis not present

## 2017-10-01 DIAGNOSIS — M79644 Pain in right finger(s): Secondary | ICD-10-CM | POA: Diagnosis not present

## 2017-10-01 DIAGNOSIS — S6991XA Unspecified injury of right wrist, hand and finger(s), initial encounter: Secondary | ICD-10-CM | POA: Diagnosis not present

## 2017-10-01 DIAGNOSIS — M7989 Other specified soft tissue disorders: Secondary | ICD-10-CM | POA: Diagnosis not present

## 2017-12-25 ENCOUNTER — Encounter: Payer: Self-pay | Admitting: Family Medicine

## 2017-12-25 ENCOUNTER — Other Ambulatory Visit: Payer: Self-pay | Admitting: Family Medicine

## 2017-12-25 MED ORDER — LEVOTHYROXINE SODIUM 75 MCG PO TABS: 75.0000 ug | ORAL_TABLET | Freq: Every day | ORAL | 0 refills | Status: DC

## 2018-01-01 ENCOUNTER — Encounter: Payer: Self-pay | Admitting: Family Medicine

## 2018-01-08 ENCOUNTER — Ambulatory Visit: Payer: 59 | Admitting: Family Medicine

## 2018-01-20 ENCOUNTER — Encounter: Payer: Self-pay | Admitting: Family Medicine

## 2018-01-20 ENCOUNTER — Ambulatory Visit: Payer: 59 | Admitting: Family Medicine

## 2018-01-20 VITALS — BP 118/68 | HR 62 | Temp 96.9°F | Ht 70.0 in | Wt 247.0 lb

## 2018-01-20 DIAGNOSIS — G4726 Circadian rhythm sleep disorder, shift work type: Secondary | ICD-10-CM

## 2018-01-20 DIAGNOSIS — E782 Mixed hyperlipidemia: Secondary | ICD-10-CM | POA: Diagnosis not present

## 2018-01-20 DIAGNOSIS — E039 Hypothyroidism, unspecified: Secondary | ICD-10-CM | POA: Diagnosis not present

## 2018-01-20 MED ORDER — HYDROXYZINE HCL 10 MG PO TABS: 10.0000 mg | ORAL_TABLET | Freq: Every evening | ORAL | 5 refills | Status: DC | PRN

## 2018-01-20 NOTE — Patient Instructions (Addendum)
You had labs performed today.  You will be contacted with the results of the labs once they are available, usually in the next 3 business days for routine lab work.   I sent the hydroxyzine over.  You should have refills on the pravastatin for the cholesterol.  I will send over the levothyroxine for a year as long as your levels are stable when today's labs result.

## 2018-01-20 NOTE — Progress Notes (Signed)
Subjective: CC: hypothyroidism, hyperlipidemia, sleep disorder PCP: Raliegh IpGottschalk, Ashly M, DO ZOX:WRUEAHPI:Eduardo [name] is a 47 y.o. male presenting to clinic today for:  1. Hypothyroidism Patient was incidentally noted to have an abnormal TSH earlier this year.  There was associated decreased energy but otherwise he was asymptomatic.  He had his level rechecked in April and it was within normal limits after being started on Synthroid 75 mcg daily.  He reports that things have been going well with current dose of Synthroid.  Denies any tremors, heart palpitations, diarrhea or constipation.  Energy is about the same.  2.  Hyperlipidemia ASCVD risk score was 5.1% in the next 10 years.  Because of unknown cardiac family history, patient wished to start cholesterol medication and was placed on Pravachol 40 mg daily.  He reports that he is taking his medication as directed.  Denies any adverse side effects, including myalgia.  He is working on [employer] Edison[employer]proving diet but again reports that his work schedule often interferes with this.  He relies on frozen foods most of the time.   3.  Shiftwork sleep disorder Patient was started on hydroxyzine 10 to 20 mg nightly as needed sleep.  He notes that hydroxyzine is working well to help with sleep.  He is often only taking 1 tablet at bedtime.  Denies any dry mouth.   ROS: Per HPI  No Known Allergies Past Medical History:  Diagnosis Date  . GERD (gastroesophageal reflux disease)     Current Outpatient Medications:  .  hydrOXYzine (ATARAX/VISTARIL) 10 MG tablet, Take 1-2 tablets (10-20 mg total) by mouth at bedtime as needed (sleep)., Disp: 30 tablet, Rfl: 0 .  ketoconazole (NIZORAL) 2 % shampoo, Wash affected areas on stomach and back.  Leave on for 5 minutes then rinse off once daily for 7 days. (Patient not taking: Reported on 09/04/2017), Disp: 120 mL, Rfl: 0 .  levothyroxine (SYNTHROID, LEVOTHROID) 75 MCG tablet, Take 1 tablet (75 mcg total) by mouth daily.,  Disp: 90 tablet, Rfl: 0 .  pravastatin (PRAVACHOL) 40 MG tablet, Take 1 tablet (40 mg total) by mouth daily., Disp: 90 tablet, Rfl: 3 Social History   Socioeconomic History  . Marital status: Married    Spouse name: Not on file  . Number of children: 0  . Years of education: Not on file  . Highest education level: Not on file  Occupational History  . Not on file  Social Needs  . Financial resource strain: Not on file  . Food insecurity:    Worry: Not on file    Inability: Not on file  . Transportation needs:    Medical: Not on file    Non-medical: Not on file  Tobacco Use  . Smoking status: Former Games developermoker  . Smokeless tobacco: Never Used  Substance and Sexual Activity  . Alcohol use: Yes    Comment: occasionally  . Drug use: No  . Sexual activity: Yes  Lifestyle  . Physical activity:    Days per week: Not on file    Minutes per session: Not on file  . Stress: Not on file  Relationships  . Social connections:    Talks on phone: Not on file    Gets together: Not on file    Attends religious service: Not on file    Active member of club or organization: Not on file    Attends meetings of clubs or organizations: Not on file    Relationship status: Not on file  .  Intimate partner violence:    Fear of current or ex partner: Not on file    Emotionally abused: Not on file    Physically abused: Not on file    Forced sexual activity: Not on file  Other Topics Concern  . Not on file  Social History Narrative  . Not on file   Family History  Adopted: Yes    Objective: Office vital signs reviewed. BP 118/68   Pulse 62   Temp (!) 96.9 F (36.1 C) (Oral)   Ht 5\' 10"  (1.778 m)   Wt 247 lb (112 kg)   BMI 35.44 kg/m   Physical Examination:  General: Awake, alert, well nourished, No acute distress HEENT: Normal    Neck: No masses palpated. No lymphadenopathy; no thyromegaly or goiter     Eyes: extraocular membranes intact, sclera white, no exophthalmos    Throat:  moist mucus membranes Cardio: regular rate and rhythm, S1S2 heard, no murmurs appreciated Pulm: clear to auscultation bilaterally anteriorly, no wheezes, rhonchi or rales; normal work of breathing on room air Extremities: warm, well perfused, No edema, cyanosis or clubbing; +2 pulses bilaterally MSK: Normal gait and normal station Skin: dry; intact; no rashes or lesions; normal temperature Neuro: No resting tremor  Assessment/ Plan: 47 y.o. male   1. Acquired hypothyroidism Doing well on current dose.  He is asymptomatic.  Will repeat TSH today.  If stable, we will plan to refill out through 1 year. - TSH  2. Shift work sleep disorder Controlled with hydroxyzine.  This is been refilled x1 year.  3. Mixed hyperlipidemia Compliant with Pravachol.  He does not need refills at this time.  Will check lipid panel given elevation in LDL and triglycerides at last check. - Lipid Panel   Orders Placed This Encounter  Procedures  . TSH  . Lipid Panel   Meds ordered this encounter  Medications  . hydrOXYzine (ATARAX/VISTARIL) 10 MG tablet    Sig: Take 1-2 tablets (10-20 mg total) by mouth at bedtime as needed (sleep).    Dispense:  60 tablet    Refill:  5     Ashly Hulen Skains, DO [facility] [phone]

## 2018-01-21 LAB — LIPID PANEL
Chol/HDL Ratio: 4.4 ratio (ref 0.0–5.0)
Cholesterol, Total: 162 mg/dL (ref 100–199)
HDL: 37 mg/dL — ABNORMAL LOW (ref >39)
LDL CALC: 100 mg/dL — AB (ref 0–99)
Triglycerides: 126 mg/dL (ref 0–149)
VLDL CHOLESTEROL CAL: 25 mg/dL (ref 5–40)

## 2018-01-21 LAB — TSH: TSH: 4.55 u[IU]/mL — ABNORMAL HIGH (ref 0.450–4.500)

## 2018-04-01 ENCOUNTER — Encounter: Payer: Self-pay | Admitting: Family Medicine

## 2018-04-02 ENCOUNTER — Other Ambulatory Visit: Payer: Self-pay | Admitting: Family Medicine

## 2018-04-02 ENCOUNTER — Other Ambulatory Visit: Payer: 59

## 2018-04-02 DIAGNOSIS — E039 Hypothyroidism, unspecified: Secondary | ICD-10-CM | POA: Diagnosis not present

## 2018-04-03 ENCOUNTER — Other Ambulatory Visit: Payer: Self-pay | Admitting: Family Medicine

## 2018-04-03 LAB — THYROID PANEL WITH TSH
FREE THYROXINE INDEX: 1.8 (ref 1.2–4.9)
T3 Uptake Ratio: 25 % (ref 24–39)
T4, Total: 7.1 ug/dL (ref 4.5–12.0)
TSH: 4.88 u[IU]/mL — ABNORMAL HIGH (ref 0.450–4.500)

## 2018-04-03 NOTE — Telephone Encounter (Signed)
Last seen 01/20/18

## 2018-04-04 ENCOUNTER — Other Ambulatory Visit: Payer: Self-pay | Admitting: Family Medicine

## 2018-04-04 MED ORDER — LEVOTHYROXINE SODIUM 88 MCG PO TABS: 88.0000 ug | ORAL_TABLET | Freq: Every day | ORAL | 0 refills | Status: DC

## 2018-05-07 ENCOUNTER — Ambulatory Visit: Payer: 59 | Admitting: Family Medicine

## 2018-05-07 ENCOUNTER — Encounter: Payer: Self-pay | Admitting: Family Medicine

## 2018-05-07 VITALS — BP 124/79 | HR 69 | Temp 98.2°F | Ht 70.0 in | Wt 257.0 lb

## 2018-05-07 DIAGNOSIS — G5712 Meralgia paresthetica, left lower limb: Secondary | ICD-10-CM

## 2018-05-07 DIAGNOSIS — Z23 Encounter for immunization: Secondary | ICD-10-CM

## 2018-05-07 DIAGNOSIS — E039 Hypothyroidism, unspecified: Secondary | ICD-10-CM | POA: Diagnosis not present

## 2018-05-07 NOTE — Progress Notes (Signed)
Subjective: CC: numb knee PCP: Raliegh Ip, DO Eduardo [name] is a 47 y.o. male presenting to clinic today for:  1. Numb knee Patient reports onset of left lateral/anterior thigh numbness that goes to just above his knee about 2 months ago.  He has not paid attention to whether or not this is more prominent with any certain positions.  There is no significant pain associated with it.  Denies any preceding injury.  Denies any back pain.  He does occasionally have some hip pain on the left side when he lies on that side.  No saddle anesthesia, fecal incontinence or urinary retention.  2. Hypothyroidism TSH at last visit 4 weeks ago subtherapeutic.  Synthroid increased to daily.  He notes tolerance of the dose this go around.  Denies any flulike symptoms or myalgia.  No constipation or diarrhea.   ROS: Per HPI  No Known Allergies Past Medical History:  Diagnosis Date  . GERD (gastroesophageal reflux disease)     Current Outpatient Medications:  .  hydrOXYzine (ATARAX/VISTARIL) 10 MG tablet, TAKE 1 TABLET BY MOUTH AT BEDTIME AS NEEDED FOR SLEEP, Disp: 30 tablet, Rfl: 2 .  ketoconazole (NIZORAL) 2 % shampoo, Wash affected areas on stomach and back.  Leave on for 5 minutes then rinse off once daily for 7 days., Disp: 120 mL, Rfl: 0 .  levothyroxine (SYNTHROID, LEVOTHROID) 88 MCG tablet, Take 1 tablet (88 mcg total) by mouth daily., Disp: 90 tablet, Rfl: 0 .  pravastatin (PRAVACHOL) 40 MG tablet, Take 1 tablet (40 mg total) by mouth daily., Disp: 90 tablet, Rfl: 3 Social History   Socioeconomic History  . Marital status: Married    Spouse name: Not on file  . Number of children: 0  . Years of education: Not on file  . Highest education level: Not on file  Occupational History  . Not on file  Social Needs  . Financial resource strain: Not on file  . Food insecurity:    Worry: Not on file    Inability: Not on file  . Transportation needs:    Medical: Not on file      Non-medical: Not on file  Tobacco Use  . Smoking status: Former Games developer  . Smokeless tobacco: Never Used  Substance and Sexual Activity  . Alcohol use: Yes    Comment: occasionally  . Drug use: No  . Sexual activity: Yes  Lifestyle  . Physical activity:    Days per week: Not on file    Minutes per session: Not on file  . Stress: Not on file  Relationships  . Social connections:    Talks on phone: Not on file    Gets together: Not on file    Attends religious service: Not on file    Active member of club or organization: Not on file    Attends meetings of clubs or organizations: Not on file    Relationship status: Not on file  . Intimate partner violence:    Fear of current or ex partner: Not on file    Emotionally abused: Not on file    Physically abused: Not on file    Forced sexual activity: Not on file  Other Topics Concern  . Not on file  Social History Narrative  . Not on file   Family History  Adopted: Yes    Objective: Office vital signs reviewed. BP 124/79   Pulse 69   Temp 98.2 F (36.8 C) (Oral)  Ht 5\' 10"  (1.778 m)   Wt 257 lb (116.6 kg)   BMI 36.88 kg/m   Physical Examination:  General: Awake, alert, well nourished, obese. No acute distress HEENT: Normal, sclera white, MMM Cardio: regular rate and rhythm, S1S2 heard, no murmurs appreciated Pulm: clear to auscultation bilaterally, no wheezes, rhonchi or rales; normal work of breathing on room air Extremities: warm, well perfused, No edema, cyanosis or clubbing; +2 pulses bilaterally MSK: normal gait and station  Lumbar spine: Some flattening of the lumbar curvature.  No midline tenderness to palpation.  No paraspinal muscle tenderness to palpation.  No palpable bony abnormalities.  He has full active range of motion of the lumbar spine.    Hips :  he has full active range of motion of bilateral lower extremities.  No tenderness to palpation to the trochanteric bursa on the left hip. Skin: dry;  intact; no rashes or lesions Neuro: 5/5 lower extremity strength.  Decreased light touch sensation along the anterior lateral thigh that starts mid thigh and ends just above the knee.  Assessment/ Plan: 47 y.o. male   1. Lateral cutaneous nerve of thigh syndrome, left I suspect compression/involvement of the lateral cutaneous nerve of the left thigh.  Differential diagnosis considered include injury to the L5 nerve but symptoms do not extend beyond the knee.  Physical exam was otherwise nonfocal.  Referral placed to PM and R for further evaluation. - Ambulatory referral to Physical Medicine Rehab  2. Acquired hypothyroidism Was slightly subtherapeutic.  He is 4 weeks status post increase of Synthroid dose to 88 mcg daily.  Will check thyroid panel today. - Thyroid Panel With TSH  3. Need for immunization against influenza - Flu Vaccine QUAD 36+ mos IM   Orders Placed This Encounter  Procedures  . Flu Vaccine QUAD 36+ mos IM  . Thyroid Panel With TSH  . Ambulatory referral to Physical Medicine Rehab    Referral Priority:   Routine    Referral Type:   Rehabilitation    Referral Reason:   Specialty Services Required    Requested Specialty:   Physical Medicine and Rehabilitation    Number of Visits Requested:   1   No orders of the defined types were placed in this encounter.    Raliegh IpAshly M , DO [facility] [phone]

## 2018-05-07 NOTE — Patient Instructions (Signed)
I think that you are dealing with meralgia paresthetica or lateral cutaneous nerve thigh syndrome.  Your spinal exam was fairly unremarkable.  I am sending you to physical medicine and rehabilitation in hopes that they will do nerve conduction studies to further evaluate.  You can find more information on this condition at:  [email or web address]

## 2018-05-08 LAB — THYROID PANEL WITH TSH
Free Thyroxine Index: 1.9 (ref 1.2–4.9)
T3 UPTAKE RATIO: 25 % (ref 24–39)
T4, Total: 7.6 ug/dL (ref 4.5–12.0)
TSH: 4.07 u[IU]/mL (ref 0.450–4.500)

## 2018-05-16 ENCOUNTER — Encounter: Payer: Self-pay | Admitting: Family Medicine

## 2018-07-04 ENCOUNTER — Encounter: Payer: Self-pay | Admitting: Family Medicine

## 2018-07-04 MED ORDER — LEVOTHYROXINE SODIUM 88 MCG PO TABS: 88.0000 ug | ORAL_TABLET | Freq: Every day | ORAL | 0 refills | Status: DC

## 2018-09-23 ENCOUNTER — Encounter: Payer: Self-pay | Admitting: Family Medicine

## 2018-09-23 ENCOUNTER — Other Ambulatory Visit: Payer: Self-pay | Admitting: Family Medicine

## 2018-09-23 ENCOUNTER — Other Ambulatory Visit: Payer: Self-pay | Admitting: *Deleted

## 2018-09-23 MED ORDER — LEVOTHYROXINE SODIUM 88 MCG PO TABS: 88.0000 ug | ORAL_TABLET | Freq: Every day | ORAL | 0 refills | Status: DC

## 2018-12-09 ENCOUNTER — Encounter: Payer: Self-pay | Admitting: Family Medicine

## 2018-12-10 ENCOUNTER — Other Ambulatory Visit: Payer: Self-pay | Admitting: Family Medicine

## 2018-12-29 ENCOUNTER — Other Ambulatory Visit: Payer: Self-pay

## 2018-12-30 ENCOUNTER — Ambulatory Visit: Payer: 59 | Admitting: Family Medicine

## 2018-12-30 ENCOUNTER — Encounter: Payer: Self-pay | Admitting: Family Medicine

## 2018-12-30 VITALS — BP 127/75 | HR 71 | Temp 97.5°F | Ht 70.0 in | Wt 254.6 lb

## 2018-12-30 DIAGNOSIS — M5442 Lumbago with sciatica, left side: Secondary | ICD-10-CM | POA: Diagnosis not present

## 2018-12-30 DIAGNOSIS — E039 Hypothyroidism, unspecified: Secondary | ICD-10-CM

## 2018-12-30 DIAGNOSIS — Z125 Encounter for screening for malignant neoplasm of prostate: Secondary | ICD-10-CM | POA: Diagnosis not present

## 2018-12-30 DIAGNOSIS — E782 Mixed hyperlipidemia: Secondary | ICD-10-CM | POA: Diagnosis not present

## 2018-12-30 MED ORDER — TIZANIDINE HCL 4 MG PO TABS: 2.0000 mg | ORAL_TABLET | Freq: Three times a day (TID) | ORAL | 0 refills | Status: DC | PRN

## 2018-12-30 MED ORDER — METHYLPREDNISOLONE ACETATE 80 MG/ML IJ SUSP
80.0000 mg | Freq: Once | INTRAMUSCULAR | Status: AC
  Administered 2018-12-30: 09:00:00 80 mg via INTRAMUSCULAR

## 2018-12-30 MED ORDER — HYDROXYZINE HCL 10 MG PO TABS: 10.0000 mg | ORAL_TABLET | Freq: Every evening | ORAL | 2 refills | Status: DC | PRN

## 2018-12-30 MED ORDER — PREDNISONE 10 MG (21) PO TBPK: ORAL_TABLET | ORAL | 0 refills | Status: DC

## 2018-12-30 MED ORDER — PRAVASTATIN SODIUM 40 MG PO TABS: 40.0000 mg | ORAL_TABLET | Freq: Every day | ORAL | 1 refills | Status: AC

## 2018-12-30 NOTE — Patient Instructions (Signed)
Acute Back Pain, Adult Acute back pain is sudden and usually short-lived. It is often caused by an injury to the muscles and tissues in the back. The injury may result from:  A muscle or ligament getting overstretched or torn (strained). Ligaments are tissues that connect bones to each other. Lifting something improperly can cause a back strain.  Wear and tear (degeneration) of the spinal disks. Spinal disks are circular tissue that provides cushioning between the bones of the spine (vertebrae).  Twisting motions, such as while playing sports or doing yard work.  A hit to the back.  Arthritis. You may have a physical exam, lab tests, and imaging tests to find the cause of your pain. Acute back pain usually goes away with rest and home care. Follow these instructions at home: Managing pain, stiffness, and swelling  Take over-the-counter and prescription medicines only as told by your health care provider.  Your health care provider may recommend applying ice during the first 24-48 hours after your pain starts. To do this: ? Put ice in a plastic bag. ? Place a towel between your skin and the bag. ? Leave the ice on for 20 minutes, 2-3 times a day.  If directed, apply heat to the affected area as often as told by your health care provider. Use the heat source that your health care provider recommends, such as a moist heat pack or a heating pad. ? Place a towel between your skin and the heat source. ? Leave the heat on for 20-30 minutes. ? Remove the heat if your skin turns bright red. This is especially important if you are unable to feel pain, heat, or cold. You have a greater risk of getting burned. Activity   Do not stay in bed. Staying in bed for more than 1-2 days can delay your recovery.  Sit up and stand up straight. Avoid leaning forward when you sit, or hunching over when you stand. ? If you work at a desk, sit close to it so you do not need to lean over. Keep your chin tucked  in. Keep your neck drawn back, and keep your elbows bent at a right angle. Your arms should look like the letter "L." ? Sit high and close to the steering wheel when you drive. Add lower back (lumbar) support to your car seat, if needed.  Take short walks on even surfaces as soon as you are able. Try to increase the length of time you walk each day.  Do not sit, drive, or stand in one place for more than 30 minutes at a time. Sitting or standing for long periods of time can put stress on your back.  Do not drive or use heavy machinery while taking prescription pain medicine.  Use proper lifting techniques. When you bend and lift, use positions that put less stress on your back: ? Bend your knees. ? Keep the load close to your body. ? Avoid twisting.  Exercise regularly as told by your health care provider. Exercising helps your back heal faster and helps prevent back injuries by keeping muscles strong and flexible.  Work with a physical therapist to make a safe exercise program, as recommended by your health care provider. Do any exercises as told by your physical therapist. Lifestyle  Maintain a healthy weight. Extra weight puts stress on your back and makes it difficult to have good posture.  Avoid activities or situations that make you feel anxious or stressed. Stress and anxiety increase muscle   tension and can make back pain worse. Learn ways to manage anxiety and stress, such as through exercise. General instructions  Sleep on a firm mattress in a comfortable position. Try lying on your side with your knees slightly bent. If you lie on your back, put a pillow under your knees.  Follow your treatment plan as told by your health care provider. This may include: ? Cognitive or behavioral therapy. ? Acupuncture or massage therapy. ? Meditation or yoga. Contact a health care provider if:  You have pain that is not relieved with rest or medicine.  You have increasing pain going down  into your legs or buttocks.  Your pain does not improve after 2 weeks.  You have pain at night.  You lose weight without trying.  You have a fever or chills. Get help right away if:  You develop new bowel or bladder control problems.  You have unusual weakness or numbness in your arms or legs.  You develop nausea or vomiting.  You develop abdominal pain.  You feel faint. Summary  Acute back pain is sudden and usually short-lived.  Use proper lifting techniques. When you bend and lift, use positions that put less stress on your back.  Take over-the-counter and prescription medicines and apply heat or ice as directed by your health care provider. This information is not intended to replace advice given to you by your health care provider. Make sure you discuss any questions you have with your health care provider. Document Released: 05/14/2005 Document Revised: 09/02/2018 Document Reviewed: 12/26/2016 Elsevier Patient Education  2020 Elsevier Inc.  

## 2018-12-30 NOTE — Progress Notes (Signed)
Subjective: CC: Low back pain PCP: Janora Norlander, DO Eduardo [name] is a 48 y.o. male presenting to clinic today for:  1.  Low back pain Patient reports onset of left-sided low back pain Sunday evening.  He notes that prior to that he been cutting grass on Saturday and installing floor on Sunday.  The pain has gradually become more severe and radiates down into the left hip.  He notes that when he lies flat it radiates down to the left lower extremity.  He does report associated left lower extremity weakness.  Denies any sensation changes.  No saddle anesthesia, fecal incontinence or urinary retention.  He has been using meloxicam and ice with no improvement in symptoms.  2.  Hypothyroidism Patient reports compliance with Synthroid 88 mcg daily.  Does not report any change in voice, heart palpitations today.  3.  Hyperlipidemia Patient compliant with Pravachol 40 mg daily.  Does not endorse chest pain, shortness of breath or abdominal pain.   ROS: Per HPI  No Known Allergies Past Medical History:  Diagnosis Date  . GERD (gastroesophageal reflux disease)     Current Outpatient Medications:  .  hydrOXYzine (ATARAX/VISTARIL) 10 MG tablet, Take 1 tablet (10 mg total) by mouth at bedtime as needed. for sleep, Disp: 30 tablet, Rfl: 2 .  levothyroxine (SYNTHROID) 88 MCG tablet, Take 1 tablet (88 mcg total) by mouth daily., Disp: 90 tablet, Rfl: 0 .  pravastatin (PRAVACHOL) 40 MG tablet, Take 1 tablet (40 mg total) by mouth daily., Disp: 90 tablet, Rfl: 1 Social History   Socioeconomic History  . Marital status: Married    Spouse name: Not on file  . Number of children: 0  . Years of education: Not on file  . Highest education level: Not on file  Occupational History  . Not on file  Social Needs  . Financial resource strain: Not on file  . Food insecurity    Worry: Not on file    Inability: Not on file  . Transportation needs    Medical: Not on file    Non-medical:  Not on file  Tobacco Use  . Smoking status: Former Research scientist (life sciences)  . Smokeless tobacco: Never Used  Substance and Sexual Activity  . Alcohol use: Yes    Comment: occasionally  . Drug use: No  . Sexual activity: Yes  Lifestyle  . Physical activity    Days per week: Not on file    Minutes per session: Not on file  . Stress: Not on file  Relationships  . Social Herbalist on phone: Not on file    Gets together: Not on file    Attends religious service: Not on file    Active member of club or organization: Not on file    Attends meetings of clubs or organizations: Not on file    Relationship status: Not on file  . Intimate partner violence    Fear of current or ex partner: Not on file    Emotionally abused: Not on file    Physically abused: Not on file    Forced sexual activity: Not on file  Other Topics Concern  . Not on file  Social History Narrative  . Not on file   Family History  Adopted: Yes    Objective: Office vital signs reviewed. BP 127/75   Pulse 71   Temp (!) 97.5 F (36.4 C) (Temporal)   Ht 5' 10"  (1.778 m)   Wt 254  lb 9.6 oz (115.5 kg)   BMI 36.53 kg/m   Physical Examination:  General: Awake, alert, obese, appears uncomfortable and sits only on right side HEENT: Normal, no exophthalmos or goiter Cardio: regular rate Pulm: normal work of breathing on room air Extremities: warm, well perfused, No edema, cyanosis or clubbing; +2 pulses bilaterally MSK: antalgic gait and stiff station  Lumbar spine: No midline tenderness palpation.  No paraspinal muscle tenderness palpation.  Active range of motion is limited secondary to pain.  Pain seems exacerbated by flexion.  He has a positive straight leg raise on left. Skin: clammy Neuro: 2/4 patellar DTRs bilaterally. No tremor  Assessment/ Plan: 48 y.o. male   1. Acute left-sided low back pain with left-sided sciatica Concern for possible herniated disc given positive straight leg raise on left.  He was  given a dose of Depo-Medrol 80 IM here in office.  Prescribed prednisone Dosepak to start tomorrow.  Zanaflex also prescribed.  Caution sedation.  Work note provided.  Red flags discussed.  Reasons for emergent evaluation emergency department noted.  Patient will follow-up in 3 months for routine thyroid check - methylPREDNISolone acetate (DEPO-MEDROL) injection 80 mg - tiZANidine (ZANAFLEX) 4 MG tablet; Take 0.5-1 tablets (2-4 mg total) by mouth every 8 (eight) hours as needed for muscle spasms.  Dispense: 30 tablet; Refill: 0 - predniSONE (STERAPRED UNI-PAK 21 TAB) 10 MG (21) TBPK tablet; As directed x 6 days (START TOMORROW 8/5)  Dispense: 21 tablet; Refill: 0  2. Acquired hypothyroidism Compliant with thyroid medicine.  We will refill based on today's thyroid panel. - Thyroid Panel With TSH  3. Mixed hyperlipidemia - CMP14+EGFR - Lipid Panel  4. Screening for prostate cancer - PSA   Orders Placed This Encounter  Procedures  . Thyroid Panel With TSH  . CMP14+EGFR  . Lipid Panel  . PSA   Meds ordered this encounter  Medications  . pravastatin (PRAVACHOL) 40 MG tablet    Sig: Take 1 tablet (40 mg total) by mouth daily.    Dispense:  90 tablet    Refill:  1  . hydrOXYzine (ATARAX/VISTARIL) 10 MG tablet    Sig: Take 1 tablet (10 mg total) by mouth at bedtime as needed. for sleep    Dispense:  30 tablet    Refill:  2    Please consider 90 day supplies to promote better adherence     Janora Norlander, [place] [phone]

## 2018-12-31 ENCOUNTER — Other Ambulatory Visit: Payer: Self-pay | Admitting: Family Medicine

## 2018-12-31 ENCOUNTER — Encounter: Payer: Self-pay | Admitting: Family Medicine

## 2018-12-31 LAB — CMP14+EGFR
ALT: 43 IU/L (ref 0–44)
AST: 24 IU/L (ref 0–40)
Albumin/Globulin Ratio: 2.1 (ref 1.2–2.2)
Albumin: 4.6 g/dL (ref 4.0–5.0)
Alkaline Phosphatase: 88 IU/L (ref 39–117)
BUN/Creatinine Ratio: 14 (ref 9–20)
BUN: 15 mg/dL (ref 6–24)
Bilirubin Total: 0.3 mg/dL (ref 0.0–1.2)
CO2: 23 mmol/L (ref 20–29)
Calcium: 9.4 mg/dL (ref 8.7–10.2)
Chloride: 101 mmol/L (ref 96–106)
Creatinine, Ser: 1.05 mg/dL (ref 0.76–1.27)
GFR calc Af Amer: 97 mL/min/1.73
GFR calc non Af Amer: 84 mL/min/1.73
Globulin, Total: 2.2 g/dL (ref 1.5–4.5)
Glucose: 115 mg/dL — ABNORMAL HIGH (ref 65–99)
Potassium: 4.8 mmol/L (ref 3.5–5.2)
Sodium: 141 mmol/L (ref 134–144)
Total Protein: 6.8 g/dL (ref 6.0–8.5)

## 2018-12-31 LAB — LIPID PANEL
Chol/HDL Ratio: 5 ratio (ref 0.0–5.0)
Cholesterol, Total: 210 mg/dL — ABNORMAL HIGH (ref 100–199)
HDL: 42 mg/dL
LDL Calculated: 143 mg/dL — ABNORMAL HIGH (ref 0–99)
Triglycerides: 127 mg/dL (ref 0–149)
VLDL Cholesterol Cal: 25 mg/dL (ref 5–40)

## 2018-12-31 LAB — THYROID PANEL WITH TSH
Free Thyroxine Index: 1.8 (ref 1.2–4.9)
T3 Uptake Ratio: 24 % (ref 24–39)
T4, Total: 7.6 ug/dL (ref 4.5–12.0)
TSH: 1.84 u[IU]/mL (ref 0.450–4.500)

## 2018-12-31 LAB — PSA: Prostate Specific Ag, Serum: 0.5 ng/mL (ref 0.0–4.0)

## 2018-12-31 MED ORDER — LEVOTHYROXINE SODIUM 88 MCG PO TABS: 88.0000 ug | ORAL_TABLET | Freq: Every day | ORAL | 3 refills | Status: DC

## 2019-01-01 ENCOUNTER — Encounter: Payer: Self-pay | Admitting: Family Medicine

## 2019-01-02 ENCOUNTER — Telehealth: Payer: Self-pay | Admitting: Family Medicine

## 2019-01-02 ENCOUNTER — Other Ambulatory Visit: Payer: Self-pay | Admitting: Family Medicine

## 2019-01-02 DIAGNOSIS — M5442 Lumbago with sciatica, left side: Secondary | ICD-10-CM

## 2019-01-02 MED ORDER — GABAPENTIN 300 MG PO CAPS: ORAL_CAPSULE | ORAL | 0 refills | Status: DC

## 2019-01-02 NOTE — Telephone Encounter (Signed)
Ok to provide

## 2019-01-02 NOTE — Telephone Encounter (Signed)
What symptoms do you have? Having tingling down in his left leg. Sent message to Dr. Darnell Level 8-5 through his MyChart  and hasn't heard from anyone. Left another message in MyChart yesterday and hadn't heart anything.  How long have you been sick? Since Tuesday  Have you been seen for this problem? Yes Tuesday  If your provider decides to give you a prescription, which pharmacy would you like for it to be sent to? [facility] in [place]   Patient informed that this information will be sent to the clinical staff for review and that they should receive a follow up call.

## 2019-01-02 NOTE — Telephone Encounter (Signed)
It looks like these were routed to Dr Livia Snellen previously.  I'm adding gabapentin.  Please ask the patient to take 1 capsule at bedtime.  May increase to 1 capsule in morning and 1 at bedtime in 3 days if needed.  If symptoms worsen or he develops red flag symptoms, please have him go to ED.

## 2019-01-02 NOTE — Telephone Encounter (Signed)
REFERRAL REQUEST Telephone Note  What type of referral do you need? MRI  Have you been seen at our office for this problem? Yes, mentioned to pcp at last ov. Pain bulging disk (Advise that they will likely need an appointment with their PCP before a referral can be done)  Is there a particular doctor or location that you prefer? no  Patient notified that referrals can take up to a week or longer to process. If they haven't heard anything within a week they should call back and speak with the referral department.

## 2019-01-02 NOTE — Telephone Encounter (Signed)
We will likely have to perform xray before MRI will be approved.  If he is willing to head to [facility] today, I can order lumbar spine imaging.  Most times insurance will not cover MRI as initial test unless having red flag symptoms.

## 2019-01-02 NOTE — Telephone Encounter (Signed)
LMTCB

## 2019-01-02 NOTE — Telephone Encounter (Signed)
Pt's note printed To front desk for pick up Pt notified

## 2019-01-02 NOTE — Telephone Encounter (Signed)
He needs a work note excusing him from last Friday until next Wednesday.  He doesn't think he will be well enough to work until at least then.

## 2019-01-02 NOTE — Telephone Encounter (Signed)
Pt requesting MRI. Please advise.

## 2019-01-05 ENCOUNTER — Encounter: Payer: Self-pay | Admitting: Physician Assistant

## 2019-01-05 ENCOUNTER — Ambulatory Visit (INDEPENDENT_AMBULATORY_CARE_PROVIDER_SITE_OTHER): Payer: 59 | Admitting: Physician Assistant

## 2019-01-05 DIAGNOSIS — M5442 Lumbago with sciatica, left side: Secondary | ICD-10-CM

## 2019-01-05 DIAGNOSIS — M545 Low back pain: Secondary | ICD-10-CM

## 2019-01-05 DIAGNOSIS — M79605 Pain in left leg: Secondary | ICD-10-CM | POA: Diagnosis not present

## 2019-01-05 MED ORDER — IBUPROFEN 800 MG PO TABS: 800.0000 mg | ORAL_TABLET | Freq: Three times a day (TID) | ORAL | 0 refills | Status: DC | PRN

## 2019-01-05 MED ORDER — HYDROCODONE-ACETAMINOPHEN 5-325 MG PO TABS: 1.0000 | ORAL_TABLET | ORAL | 0 refills | Status: DC | PRN

## 2019-01-05 NOTE — Progress Notes (Signed)
Telephone visit  Subjective: CC: Chronic back pain with left leg pain and numbness PCP: Terald Sleeper, PA-C Eduardo [name] is a 48 y.o. male calls for telephone consult today. Patient provides verbal consent for consult held via phone.  Patient is identified with 2 separate identifiers.  At this time the entire area is on COVID-19 social distancing and stay home orders are in place.  Patient is of higher risk and therefore we are performing this by a virtual method.  Location of patient: Home Location of provider: [facility] Others present for call: No  Approximately 1 week ago the patient had had a couple days of hard work.  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  During the night he was having a little bit of pain and it Tuesday morning 4:30 AM he was taken a shower and felt his back go out.  He was just picking up his left leg to put it in his underwear.  He had a very hard time getting dressed and doing any type of movement, extension, flexion.  He was seen through our office and Dr. Lajuana Ripple.  And started with some muscle relaxant along with the Daypro Medrol shot and continued prednisone.  He states that he has not had any improvement.  The pain is still very excruciating and well above 10 out of 10.  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  He has finished his prednisone.  He has not had a tremendous amount of improvement.  We did have him come in for x-ray tomorrow in our office.  And I am placing an urgent sports medication referral for him.  He had had a couple episodes like this in the past but never to this severity.  His father does have known degenerative disc disease and ended up with ruptured disc and a fusion.  He has a work note to be out through Wednesday of this week.  He does have the weekend  off.   ROS: Per HPI  No Known Allergies Past Medical History:  Diagnosis Date  . GERD (gastroesophageal reflux disease)     Current Outpatient Medications:  .  gabapentin (NEURONTIN) 300 MG capsule, Take 1 capsule (300 mg total) by mouth at bedtime for 3 days, THEN 1 capsule (300 mg total) 2 (two) times daily for 27 days., Disp: 60 capsule, Rfl: 0 .  HYDROcodone-acetaminophen (NORCO) 5-325 MG tablet, Take 1 tablet by mouth every 4 (four) hours as needed for moderate pain., Disp: 30 tablet, Rfl: 0 .  hydrOXYzine (ATARAX/VISTARIL) 10 MG tablet, Take 1 tablet (10 mg total) by mouth at bedtime as needed. for sleep, Disp: 30 tablet, Rfl: 2 .  ibuprofen (ADVIL) 800 MG tablet, Take 1 tablet (800 mg total) by mouth every 8 (eight) hours as needed., Disp: 30 tablet, Rfl: 0 .  levothyroxine (SYNTHROID) 88 MCG tablet, Take 1 tablet (88 mcg total) by mouth daily., Disp: 90 tablet, Rfl: 3 .  pravastatin (PRAVACHOL) 40 MG tablet, Take 1 tablet (40 mg total) by mouth daily., Disp: 90 tablet, Rfl: 1 .  predniSONE (STERAPRED UNI-PAK 21 TAB) 10 MG (21) TBPK tablet, As directed x 6 days (START TOMORROW 8/5), Disp: 21 tablet, Rfl: 0 .  tiZANidine (ZANAFLEX) 4 MG tablet, Take 0.5-1 tablets (2-4 mg total)  by mouth every 8 (eight) hours as needed for muscle spasms., Disp: 30 tablet, Rfl: 0  Assessment/ Plan: 48 y.o. male   1. Acute left-sided low back pain with left-sided sciatica - Ambulatory referral to Sports Medicine - DG Lumbar Spine 2-3 Views; Future - ibuprofen (ADVIL) 800 MG tablet; Take 1 tablet (800 mg total) by mouth every 8 (eight) hours as needed.  Dispense: 30 tablet; Refill: 0 - HYDROcodone-acetaminophen (NORCO) 5-325 MG tablet; Take 1 tablet by mouth every 4 (four) hours as needed for moderate pain.  Dispense: 30 tablet; Refill: 0  2. Lumbar pain with radiation down left leg - Ambulatory referral to Sports Medicine - DG Lumbar Spine 2-3 Views; Future - ibuprofen (ADVIL) 800 MG tablet; Take  1 tablet (800 mg total) by mouth every 8 (eight) hours as needed.  Dispense: 30 tablet; Refill: 0 - HYDROcodone-acetaminophen (NORCO) 5-325 MG tablet; Take 1 tablet by mouth every 4 (four) hours as needed for moderate pain.  Dispense: 30 tablet; Refill: 0   Return if symptoms worsen or fail to improve.  Continue all other maintenance medications as listed above.  Start time: 8:19 AM End time: 8:41 AM  Meds ordered this encounter  Medications  . ibuprofen (ADVIL) 800 MG tablet    Sig: Take 1 tablet (800 mg total) by mouth every 8 (eight) hours as needed.    Dispense:  30 tablet    Refill:  0    Order Specific Question:   Supervising Provider    Answer:   Raliegh IpGOTTSCHALK, ASHLY M [[id number]]  . HYDROcodone-acetaminophen (NORCO) 5-325 MG tablet    Sig: Take 1 tablet by mouth every 4 (four) hours as needed for moderate pain.    Dispense:  30 tablet    Refill:  0    Order Specific Question:   Supervising Provider    Answer:   Raliegh IpGOTTSCHALK, ASHLY M [[id number]]    Prudy FeelerAngel Zanyah Lentsch PA-C [facility] [phone]

## 2019-01-05 NOTE — Patient Instructions (Signed)
Acute Back Pain, Adult Acute back pain is sudden and usually short-lived. It is often caused by an injury to the muscles and tissues in the back. The injury may result from:  A muscle or ligament getting overstretched or torn (strained). Ligaments are tissues that connect bones to each other. Lifting something improperly can cause a back strain.  Wear and tear (degeneration) of the spinal disks. Spinal disks are circular tissue that provides cushioning between the bones of the spine (vertebrae).  Twisting motions, such as while playing sports or doing yard work.  A hit to the back.  Arthritis. You may have a physical exam, lab tests, and imaging tests to find the cause of your pain. Acute back pain usually goes away with rest and home care. Follow these instructions at home: Managing pain, stiffness, and swelling  Take over-the-counter and prescription medicines only as told by your health care provider.  Your health care provider may recommend applying ice during the first 24-48 hours after your pain starts. To do this: ? Put ice in a plastic bag. ? Place a towel between your skin and the bag. ? Leave the ice on for 20 minutes, 2-3 times a day.  If directed, apply heat to the affected area as often as told by your health care provider. Use the heat source that your health care provider recommends, such as a moist heat pack or a heating pad. ? Place a towel between your skin and the heat source. ? Leave the heat on for 20-30 minutes. ? Remove the heat if your skin turns bright red. This is especially important if you are unable to feel pain, heat, or cold. You have a greater risk of getting burned. Activity   Do not stay in bed. Staying in bed for more than 1-2 days can delay your recovery.  Sit up and stand up straight. Avoid leaning forward when you sit, or hunching over when you stand. ? If you work at a desk, sit close to it so you do not need to lean over. Keep your chin tucked  in. Keep your neck drawn back, and keep your elbows bent at a right angle. Your arms should look like the letter "L." ? Sit high and close to the steering wheel when you drive. Add lower back (lumbar) support to your car seat, if needed.  Take short walks on even surfaces as soon as you are able. Try to increase the length of time you walk each day.  Do not sit, drive, or stand in one place for more than 30 minutes at a time. Sitting or standing for long periods of time can put stress on your back.  Do not drive or use heavy machinery while taking prescription pain medicine.  Use proper lifting techniques. When you bend and lift, use positions that put less stress on your back: ? Bend your knees. ? Keep the load close to your body. ? Avoid twisting.  Exercise regularly as told by your health care provider. Exercising helps your back heal faster and helps prevent back injuries by keeping muscles strong and flexible.  Work with a physical therapist to make a safe exercise program, as recommended by your health care provider. Do any exercises as told by your physical therapist. Lifestyle  Maintain a healthy weight. Extra weight puts stress on your back and makes it difficult to have good posture.  Avoid activities or situations that make you feel anxious or stressed. Stress and anxiety increase muscle   tension and can make back pain worse. Learn ways to manage anxiety and stress, such as through exercise. General instructions  Sleep on a firm mattress in a comfortable position. Try lying on your side with your knees slightly bent. If you lie on your back, put a pillow under your knees.  Follow your treatment plan as told by your health care provider. This may include: ? Cognitive or behavioral therapy. ? Acupuncture or massage therapy. ? Meditation or yoga. Contact a health care provider if:  You have pain that is not relieved with rest or medicine.  You have increasing pain going down  into your legs or buttocks.  Your pain does not improve after 2 weeks.  You have pain at night.  You lose weight without trying.  You have a fever or chills. Get help right away if:  You develop new bowel or bladder control problems.  You have unusual weakness or numbness in your arms or legs.  You develop nausea or vomiting.  You develop abdominal pain.  You feel faint. Summary  Acute back pain is sudden and usually short-lived.  Use proper lifting techniques. When you bend and lift, use positions that put less stress on your back.  Take over-the-counter and prescription medicines and apply heat or ice as directed by your health care provider. This information is not intended to replace advice given to you by your health care provider. Make sure you discuss any questions you have with your health care provider. Document Released: 05/14/2005 Document Revised: 09/02/2018 Document Reviewed: 12/26/2016 Elsevier Patient Education  2020 Elsevier Inc.  

## 2019-01-05 NOTE — Telephone Encounter (Signed)
Patient seen on 01/05/2019 with Eduardo [name]

## 2019-01-06 ENCOUNTER — Other Ambulatory Visit: Payer: 59

## 2019-01-06 ENCOUNTER — Telehealth: Payer: Self-pay | Admitting: Physician Assistant

## 2019-01-06 ENCOUNTER — Other Ambulatory Visit: Payer: Self-pay

## 2019-01-06 ENCOUNTER — Ambulatory Visit (INDEPENDENT_AMBULATORY_CARE_PROVIDER_SITE_OTHER): Payer: 59

## 2019-01-06 ENCOUNTER — Ambulatory Visit (INDEPENDENT_AMBULATORY_CARE_PROVIDER_SITE_OTHER): Payer: 59 | Admitting: Sports Medicine

## 2019-01-06 VITALS — BP 149/74 | Ht 70.0 in | Wt 254.0 lb

## 2019-01-06 DIAGNOSIS — M5416 Radiculopathy, lumbar region: Secondary | ICD-10-CM | POA: Diagnosis not present

## 2019-01-06 DIAGNOSIS — M5442 Lumbago with sciatica, left side: Secondary | ICD-10-CM

## 2019-01-06 DIAGNOSIS — M79605 Pain in left leg: Secondary | ICD-10-CM | POA: Diagnosis not present

## 2019-01-06 DIAGNOSIS — M545 Low back pain, unspecified: Secondary | ICD-10-CM

## 2019-01-06 NOTE — Progress Notes (Signed)
PCP: Terald Sleeper, PA-C  Subjective:   HPI: Patient is a 48 year old male who comes in today complaining of 8 days of excruciating left leg pain.  Pain began acutely while he was working on the floor of his house.  He began to get some stiffness in the left side of his low back.  This was followed shortly thereafter by an acute onset of pain in the same area when he was bending over to put on his socks.  Pain was excruciating.  He saw his primary care physician who placed him on prednisone.  Prednisone was ineffective.  He was also given a muscle relaxer which was not beneficial.  He is currently on 300 mg of gabapentin which has been helpful but not curative.  He is scheduled to increase that dose tomorrow to 300 mg twice daily.  Pain begins in the left side of his low back and radiates into the left quad.  He has associated numbness and tingling.  He does endorse weakness as well.  Pain is worse with standing.  Improves with sitting.  He denies change in bowel or bladder.  Recent x-rays of his lumbar spine show nothing acute and no significant degenerative changes.  Patient denies any similar episodes in the past.  No prior low back surgery.  Past medical history reviewed Medications reviewed Allergies reviewed     Objective:   Well-developed, well-nourished.  He is in some moderate distress due to his pain.  Vital signs reviewed  Lumbar spine: Limited lumbar range of motion.  No tenderness to palpation.  Left hip: Smooth painless hip range of motion with a negative logroll.  Neurological exam: Patient has 4/5 strength with resisted hip flexion on the left compared to 5/5 on the right.  5/5 strength with resisted ankle dorsiflexion, plantar flexion, and great toe extension.  There is decreased sensation to light touch along the anterior lateral thigh on the left compared to the right.  No atrophy.  Reflexes are brisk and equal at the Achilles and patellar tendons bilaterally.  Good  pulses.  X-rays of the lumbar spine as above  Assessment & Plan:  Left leg lumbar radiculopathy worrisome for lumbar disc herniation  Patient has had intractable pain for 8 days now despite appropriate treatment with prednisone, muscle relaxers, and gabapentin.  I am concerned that he may have a lumbar disc herniation, specifically at the L2 or L3 level, which may need to be surgically decompressed.  I recommended that we obtain an MRI ASAP.  Phone follow-up with those results when available.  In the meantime, he will increase his gabapentin tomorrow to 300 mg twice daily.  Patient currently has no red flag symptoms to suggest cauda equina.

## 2019-01-06 NOTE — Progress Notes (Signed)
Left message to please call our office. 

## 2019-01-06 NOTE — Telephone Encounter (Signed)
Patient seen since phone call.  This encounter will now be closed 

## 2019-01-07 NOTE — Telephone Encounter (Signed)
Left message to please call our office. 

## 2019-01-08 ENCOUNTER — Encounter: Payer: Self-pay | Admitting: Physician Assistant

## 2019-01-08 ENCOUNTER — Ambulatory Visit
Admission: RE | Admit: 2019-01-08 | Discharge: 2019-01-08 | Disposition: A | Discharge status: Home / Self Care | Payer: No Typology Code available for payment source | Source: Ambulatory Visit | Attending: Sports Medicine | Admitting: Sports Medicine

## 2019-01-08 ENCOUNTER — Other Ambulatory Visit: Payer: Self-pay | Admitting: Physician Assistant

## 2019-01-08 ENCOUNTER — Other Ambulatory Visit: Payer: Self-pay

## 2019-01-08 DIAGNOSIS — M5416 Radiculopathy, lumbar region: Secondary | ICD-10-CM

## 2019-01-09 ENCOUNTER — Telehealth: Payer: Self-pay | Admitting: Sports Medicine

## 2019-01-09 ENCOUNTER — Other Ambulatory Visit: Payer: Self-pay | Admitting: Sports Medicine

## 2019-01-09 DIAGNOSIS — M5416 Radiculopathy, lumbar region: Secondary | ICD-10-CM

## 2019-01-09 NOTE — Telephone Encounter (Signed)
Opened by mistake.

## 2019-01-09 NOTE — Addendum Note (Signed)
Addended by: Cyd Silence on: 01/09/2019 11:52 AM   Modules accepted: Orders

## 2019-01-09 NOTE — Telephone Encounter (Signed)
  I spoke with Eduardo [name] on the phone today after reviewing his MRI of his lumbar spine. No disc herniation, but there is some mild narrowing at L4-L5 that could affect the left L5 nerve root. I've recommended a diagnostic/ therapeutic lumbar ESI with office follow up with me 3-4 days later for a check on his progress.

## 2019-01-13 ENCOUNTER — Telehealth: Payer: Self-pay | Admitting: Physician Assistant

## 2019-01-13 NOTE — Telephone Encounter (Signed)
This needs to go to [facility]

## 2019-01-14 ENCOUNTER — Ambulatory Visit
Admission: RE | Admit: 2019-01-14 | Discharge: 2019-01-14 | Disposition: A | Discharge status: Home / Self Care | Payer: 59 | Source: Ambulatory Visit | Attending: Sports Medicine | Admitting: Sports Medicine

## 2019-01-14 DIAGNOSIS — M5416 Radiculopathy, lumbar region: Secondary | ICD-10-CM

## 2019-01-14 MED ORDER — IOPAMIDOL (ISOVUE-M 200) INJECTION 41%
1.0000 mL | Freq: Once | INTRAMUSCULAR | Status: AC
  Administered 2019-01-14: 12:00:00 1 mL via EPIDURAL

## 2019-01-14 MED ORDER — METHYLPREDNISOLONE ACETATE 40 MG/ML INJ SUSP (RADIOLOG
120.0000 mg | Freq: Once | INTRAMUSCULAR | Status: AC
  Administered 2019-01-14: 120 mg via EPIDURAL

## 2019-01-14 NOTE — Discharge Instructions (Signed)

## 2019-01-15 ENCOUNTER — Encounter: Payer: Self-pay | Admitting: Physician Assistant

## 2019-01-15 NOTE — Telephone Encounter (Signed)
Forms & notes faxed today

## 2019-01-19 ENCOUNTER — Encounter: Payer: Self-pay | Admitting: Physician Assistant

## 2019-01-22 ENCOUNTER — Other Ambulatory Visit: Payer: Self-pay

## 2019-01-22 ENCOUNTER — Ambulatory Visit: Payer: 59 | Admitting: Sports Medicine

## 2019-01-22 VITALS — BP 132/60 | Wt 250.0 lb

## 2019-01-22 DIAGNOSIS — M5126 Other intervertebral disc displacement, lumbar region: Secondary | ICD-10-CM | POA: Diagnosis not present

## 2019-01-22 DIAGNOSIS — M5136 Other intervertebral disc degeneration, lumbar region: Secondary | ICD-10-CM

## 2019-01-22 DIAGNOSIS — M5416 Radiculopathy, lumbar region: Secondary | ICD-10-CM | POA: Diagnosis not present

## 2019-01-22 NOTE — Patient Instructions (Addendum)
Referral placed for [facility]  Suspect that numbness and tingling will improve with PT. If you have worsening back pain, let us know so we can schedule a second epidural steroid injection.   Dr. Lynann Bologna, [address] [place]. 8.28.20 @ 10:30a [address], [place], [place] [address] Phone: [phone]   We will fill out paperwork to keep you out of work for 1 more month until we see you next.

## 2019-01-22 NOTE — Progress Notes (Deleted)
PCP: Terald Sleeper, PA-C  Subjective:   HPI: Patient is a 48 y.o. male here for ***.  ***  Past Medical History:  Diagnosis Date  . GERD (gastroesophageal reflux disease)     Current Outpatient Medications on File Prior to Visit  Medication Sig Dispense Refill  . gabapentin (NEURONTIN) 300 MG capsule Take 1 capsule (300 mg total) by mouth at bedtime for 3 days, THEN 1 capsule (300 mg total) 2 (two) times daily for 27 days. 60 capsule 0  . HYDROcodone-acetaminophen (NORCO) 5-325 MG tablet Take 1 tablet by mouth every 4 (four) hours as needed for moderate pain. (Patient not taking: Reported on 01/06/2019) 30 tablet 0  . hydrOXYzine (ATARAX/VISTARIL) 10 MG tablet Take 1 tablet (10 mg total) by mouth at bedtime as needed. for sleep 30 tablet 2  . ibuprofen (ADVIL) 800 MG tablet Take 1 tablet (800 mg total) by mouth every 8 (eight) hours as needed. (Patient not taking: Reported on 01/06/2019) 30 tablet 0  . levothyroxine (SYNTHROID) 88 MCG tablet Take 1 tablet (88 mcg total) by mouth daily. 90 tablet 3  . pravastatin (PRAVACHOL) 40 MG tablet Take 1 tablet (40 mg total) by mouth daily. 90 tablet 1  . tiZANidine (ZANAFLEX) 4 MG tablet Take 0.5-1 tablets (2-4 mg total) by mouth every 8 (eight) hours as needed for muscle spasms. 30 tablet 0   No current facility-administered medications on file prior to visit.     Past Surgical History:  Procedure Laterality Date  . FOOT SURGERY Right    had accessory bone removed.    No Known Allergies  Social History   Socioeconomic History  . Marital status: Married    Spouse name: Not on file  . Number of children: 0  . Years of education: Not on file  . Highest education level: Not on file  Occupational History  . Not on file  Social Needs  . Financial resource strain: Not on file  . Food insecurity    Worry: Not on file    Inability: Not on file  . Transportation needs    Medical: Not on file    Non-medical: Not on file  Tobacco Use   . Smoking status: Former Research scientist (life sciences)  . Smokeless tobacco: Never Used  Substance and Sexual Activity  . Alcohol use: Yes    Comment: occasionally  . Drug use: No  . Sexual activity: Yes  Lifestyle  . Physical activity    Days per week: Not on file    Minutes per session: Not on file  . Stress: Not on file  Relationships  . Social Herbalist on phone: Not on file    Gets together: Not on file    Attends religious service: Not on file    Active member of club or organization: Not on file    Attends meetings of clubs or organizations: Not on file    Relationship status: Not on file  . Intimate partner violence    Fear of current or ex partner: Not on file    Emotionally abused: Not on file    Physically abused: Not on file    Forced sexual activity: Not on file  Other Topics Concern  . Not on file  Social History Narrative  . Not on file    Family History  Adopted: Yes    There were no vitals taken for this visit.  Review of Systems: See HPI above.     Objective:  Physical Exam:  Gen: NAD, comfortable in exam room  ***   Assessment & Plan:  1. ***

## 2019-01-22 NOTE — Progress Notes (Signed)
Eduardo Eduardo [name] - 48 y.o. Eduardo [name] MRN [id number]  Date of birth: [birth year]  SUBJECTIVE:   CC: f/u lumbar back pain   48 yo Eduardo [name] coming in for f/u of back pain. Had acute event on 8/3 where he was working on floor of house and was bending over, had acute onset of pain. Was last seen in office on 8/11; MRI was ordered which showed mild bulging of disc at L4-L5 with possible compression of L5. He had an epidural steroid injection on 8/19 that significantly improved back pain although he is still having persistent numbness and tingling in left anterior thigh. Taking gabapentin 300 mg BID. No change in bowel/bladder function. No weakness of left leg.   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  ROS: No unexpected weight loss, fever, chills, swelling, instability, muscle pain, redness, otherwise see HPI   PMHx - Updated and reviewed.  Contributory factors include: Negative PSHx - Updated and reviewed.  Contributory factors include:  Negative FHx - Updated and reviewed.  Contributory factors include:  Negative Social Hx - Updated and reviewed. Contributory factors include: Negative Medications - reviewed   DATA REVIEWED: 8/13: MRI report lumbar spine: Mild bulging of the disc and mild facet prominence at L4-L5. There is some potential that the left L5 nerve could be compressed, though definite nerve compression not visualized.  PHYSICAL EXAM:  VS: BP:   HR: bpm  TEMP: ( )  RESP:   HT:    WT:   BMI:  PHYSICAL EXAM: Gen: NAD, alert, cooperative with exam, well-appearing HEENT: clear conjunctiva,  CV:  no edema, capillary refill brisk, normal rate Resp: non-labored Skin: no rashes, normal turgor  Neuro: no gross deficits.  Psych:  alert and oriented  Lumbar spine: - Inspection: no gross deformity or asymmetry, swelling or ecchymosis - Palpation: No TTP over the spinous processes, paraspinal muscles, or SI joints b/l - ROM: mild limitation in ROM with  flexion, extension - Strength: 5/5 strength of lower extremity in L4-S1 nerve root distributions b/l; normal gait - Neuro: decreased sensation to light touch over anterior thigh medial knee (L4 distribution), 2+ L4 and S1 reflexes   ASSESSMENT & PLAN:   48 yo Eduardo [name] with radiculopathy from mild L4-L5 disc herniation. Although he continues to have numbness, reassuring that he has good strength and normal reflexes. Back pain has significantly improved with epidural steroid injection so that he can engage in physical therapy which should hopefully improve symptoms. He does not wish to have a second epidural injection at this time as his pain is significantly improved, but if it returns he will reach out to schedule this. Although I suspect that he does not need surgical intervention at this time, will also refer to Dr. Yevette Edwardsumonski for second opinion. He will need to be out of work until this resolves as it can be aggravated by manual labor required with his job. Will keep him out of work until I see him back in 1 month for follow up.   Patient seen and evaluated with the sports medicine fellow.  I agree with the above plan of care.  Patient's pain has improved by about 50% after a single lumbar ESI at L4-L5.  Patient will start physical therapy.  He has a very physically demanding job so I think we need to keep him out of work until follow-up with me in 1 month.  In the meantime, I would like to elicit the  input of Dr. Lynann Bologna.

## 2019-02-03 ENCOUNTER — Other Ambulatory Visit: Payer: Self-pay

## 2019-02-03 ENCOUNTER — Ambulatory Visit: Payer: 59 | Attending: Sports Medicine | Admitting: Physical Therapy

## 2019-02-03 ENCOUNTER — Encounter: Payer: Self-pay | Admitting: Physician Assistant

## 2019-02-03 ENCOUNTER — Encounter: Payer: Self-pay | Admitting: Physical Therapy

## 2019-02-03 DIAGNOSIS — M5442 Lumbago with sciatica, left side: Secondary | ICD-10-CM | POA: Insufficient documentation

## 2019-02-03 NOTE — Therapy (Addendum)
[facility] [address] [place], [place], [address] Phone: [phone]   Fax:  [phone]  Physical Therapy Evaluation  Patient Details  Name: Eduardo [name] MRN: [id number] Date of Birth: [birth year] Referring Provider (PT): Reino Bellis DO.   Encounter Date: 02/03/2019  PT End of Session - 02/03/19 0955    Visit Number  1    Number of Visits  12    Date for PT Re-Evaluation  03/17/19    PT Start Time  0900    PT Stop Time  0950    PT Time Calculation (min)  50 min    Activity Tolerance  Patient tolerated treatment well    Behavior During Therapy  [facility] for tasks assessed/performed       Past Medical History:  Diagnosis Date  . GERD (gastroesophageal reflux disease)     Past Surgical History:  Procedure Laterality Date  . FOOT SURGERY Right    had accessory bone removed.    There were no vitals filed for this visit.   Subjective Assessment - 02/03/19 0936    Subjective  COVID-19 screen performed prior to patient entering clinic.  The patient states that several months ago he began to experience left thigh numbness but did not have pain associated with it.  He saw a physician at that time.  On 12/30/18 after getting out of the shower he felt severe left LE pain down the length of his left LE.  The pain was so severe he could barley lift has left LE.  Hehas received an epidural injection and it has been helpful with a lowered pain-level to 6/10 today.  He will likely receive another injection at a different level.    Pertinent History  Hypothroidism.  Right foot surgery.    How long can you walk comfortably?  Short distances.         [facility] PT Assessment - 02/03/19 0001      Assessment   Medical Diagnosis  Lumbar radiculopathy.    Referring Provider (PT)  Reino Bellis DO.    Onset Date/Surgical Date  --   12/30/18.     Precautions   Precautions  None      Restrictions   Weight Bearing Restrictions  No      Balance  Screen   Has the patient fallen in the past 6 months  No    Has the patient had a decrease in activity level because of a fear of falling?   No    Is the patient reluctant to leave their home because of a fear of falling?   No      Home Environment   Living Environment  Private residence      Posture/Postural Control   Posture/Postural Control  No significant limitations      Deep Tendon Reflexes   DTR Assessment Site  Patella;Achilles    Patella DTR  2+    Achilles DTR  2+      ROM / Strength   AROM / PROM / Strength  AROM;Strength      AROM   Overall AROM Comments  Essentially normal active lumbar range of motion.      Strength   Overall Strength Comments  Normal LE strength.      Palpation   Palpation comment  CC is that of left LE pain into anterior and posterior       Special Tests   Other special tests  Mild pain with a left  SLR.  (-) FABER test.      Ambulation/Gait   Gait Comments  Slow and cautious.                Objective measurements completed on examination: See above findings.      [facility] Adult PT Treatment/Exercise - 02/03/19 0001      Modalities   Modalities  Electrical Stimulation;Moist Heat      Moist Heat Therapy   Number Minutes Moist Heat  20 Minutes    Moist Heat Location  --   Lumbar.     Programme researcher, broadcasting/film/videolectrical Stimulation   Electrical Stimulation Location  --   Left low back.   Electrical Stimulation Action  Pre-mod.    Electrical Stimulation Parameters  80-150 Hz x 20 minutes.    Electrical Stimulation Goals  Pain             PT Education - 02/03/19 0946    Education Details  SKTC.    Person(s) Educated  Patient    Methods  Explanation    Comprehension  Verbalized understanding;Returned demonstration          PT Long Term Goals - 02/03/19 1044      PT LONG TERM GOAL #1   Title  Independent with a HEP.    Time  6    Period  Weeks    Status  New      PT LONG TERM GOAL #2   Title  Walk a community distance with  pain not > 3/10.    Time  6    Period  Weeks    Status  New      PT LONG TERM GOAL #3   Title  Eliminate left LE symptoms.    Time  6    Period  Weeks    Status  New      PT LONG TERM GOAL #4   Title  Perform ADL's with pain not > 3/10.    Time  6    Period  Weeks    Status  New             Plan - 02/03/19 0955    Clinical Impression Statement  The patient presents to OPPT with c/o left LE pain that came on suddenly on 12/30/18.  His pain was severe such that he could barely lift his left LE.  A recent epidural injection was very helpful.  His CC is pain into his left anterior and posterior thigh.  His strength and LE DTR's are normal.  Patient will benefit from skilled physical therapy intervention to address deficits and pain.    Examination-Activity Limitations  Locomotion Level;Other    Examination-Participation Restrictions  Other    Stability/Clinical Decision Making  Evolving/Moderate complexity    Clinical Decision Making  Moderate    Rehab Potential  Good    PT Frequency  2x / week    PT Duration  6 weeks    PT Treatment/Interventions  ADLs/Self Care Home Management;Cryotherapy;Electrical Stimulation;Ultrasound;Traction;Moist Heat;Therapeutic activities;Therapeutic exercise;Manual techniques;Patient/family education    PT Next Visit Plan  Modalites as needed.  Core exrecise progression.  Can begin Int traction at 35% body weight.    Consulted and Agree with Plan of Care  Patient       Patient will benefit from skilled therapeutic intervention in order to improve the following deficits and impairments:  Pain, Decreased activity tolerance  Visit Diagnosis: Acute left-sided low back pain with left-sided sciatica - Plan: PT plan of  care cert/re-cert     Problem List Patient Active Problem List   Diagnosis Date Noted  . Shift work sleep disorder 09/04/2017  . Pre-diabetes 09/04/2017  . Hypothyroidism 09/04/2017  . Cyst of epididymis determined by ultrasound  09/03/2017  . Hyperlipidemia 09/03/2017  . Adult BMI 35.0-35.9 kg/sq m 07/29/2017  . Elevated blood-pressure reading without diagnosis of hypertension 07/29/2017  . Plantar fasciitis 07/29/2017    Aleicia Kenagy, [place] MPT 02/03/2019, 11:56 AM  [facility] [place], [place], [address] Phone: [phone]   Fax:  [phone]  Name: Eduardo [name] MRN: [id number] Date of Birth: [birth year]

## 2019-02-04 ENCOUNTER — Other Ambulatory Visit: Payer: Self-pay | Admitting: Physician Assistant

## 2019-02-04 DIAGNOSIS — M5442 Lumbago with sciatica, left side: Secondary | ICD-10-CM

## 2019-02-04 MED ORDER — GABAPENTIN 400 MG PO CAPS: 400.0000 mg | ORAL_CAPSULE | Freq: Two times a day (BID) | ORAL | 0 refills | Status: DC

## 2019-02-05 ENCOUNTER — Other Ambulatory Visit: Payer: Self-pay

## 2019-02-05 ENCOUNTER — Other Ambulatory Visit: Payer: Self-pay | Admitting: Orthopedic Surgery

## 2019-02-05 ENCOUNTER — Ambulatory Visit: Payer: 59 | Admitting: Physical Therapy

## 2019-02-05 DIAGNOSIS — G8929 Other chronic pain: Secondary | ICD-10-CM

## 2019-02-05 DIAGNOSIS — M5442 Lumbago with sciatica, left side: Secondary | ICD-10-CM | POA: Diagnosis not present

## 2019-02-05 DIAGNOSIS — M545 Low back pain, unspecified: Secondary | ICD-10-CM

## 2019-02-05 NOTE — Patient Instructions (Signed)
Pelvic Tilt: Posterior - Legs Bent (Supine)   Tighten stomach and flatten back by rolling pelvis down. Hold _10___ seconds. Relax. Repeat _10-30___ times per set. Do __2__ sets per session. Do _2___ sessions per day.   . Straight Leg Raise   Tighten stomach and slowly raise locked right leg __4__ inches from floor. Repeat __10-30__ times per set. Do __2__ sets per session. Do __2__ sessions per day.    Bent Leg Lift (Hook-Lying)   Tighten stomach and slowly raise right leg _5___ inches from floor. Keep trunk rigid. Hold _3___ seconds. Repeat _10___ times per set. Do ___2-3_ sets per session. Do __2__ sessions per day.  Brushing Teeth    Place one foot on ledge and one hand on counter. Bend other knee slightly to keep back straight.  Copyright  VHI. All rights reserved.  Refrigerator   Squat with knees apart to reach lower shelves and drawers.   Copyright  VHI. All rights reserved.  Laundry Basket   Squat down and hold basket close to stand. Use leg muscles to do the work.   Copyright  VHI. All rights reserved.  Housework - Vacuuming   Hold the vacuum with arm held at side. Step back and forth to move it, keeping head up. Avoid twisting.   Copyright  VHI. All rights reserved.  Housework - Wiping   Position yourself as close as possible to reach work surface. Avoid straining your back.   Copyright  VHI. All rights reserved.  Gardening - Mowing   Keep arms close to sides and walk with lawn mower.   Copyright  VHI. All rights reserved.  Sleeping on Side   Place pillow between knees. Use cervical support under neck and a roll around waist as needed.   Copyright  VHI. All rights reserved.  Log Roll   Lying on back, bend left knee and place left arm across chest. Roll all in one movement to the right. Reverse to roll to the left. Always move as one unit.   Copyright  VHI. All rights reserved.  Stand to Sit / Sit to Stand   To sit: Bend  knees to lower self onto front edge of chair, then scoot back on seat. To stand: Reverse sequence by placing one foot forward, and scoot to front of seat. Use rocking motion to stand up.  Copyright  VHI. All rights reserved.  Posture - Standing   Good posture is important. Avoid slouching and forward head thrust. Maintain curve in low back and align ears over shoul- ders, hips over ankles.   Copyright  VHI. All rights reserved.  Posture - Sitting   Sit upright, head facing forward. Try using a roll to support lower back. Keep shoulders relaxed, and avoid rounded back. Keep hips level with knees. Avoid crossing legs for long periods.   Copyright  VHI. All rights reserved.  Computer Work   Position work to face forward. Use proper work and seat height. Keep shoulders back and down, wrists straight, and elbows at right angles. Use chair that provides full back support. Add footrest and lumbar roll as needed.   Copyright  VHI. All rights reserved.     

## 2019-02-05 NOTE — Therapy (Signed)
[facility] [address] [place], [place], [address] Phone: [phone]   Fax:  [phone]  Physical Therapy Treatment  Patient Details  Name: Eduardo [name] MRN: [id number] Date of Birth: 15-Mar-1971 Referring Provider (PT): Reino Bellisimothy Draper DO.   Encounter Date: 02/05/2019  PT End of Session - 02/05/19 1030    Visit Number  2    Number of Visits  12    Date for PT Re-Evaluation  03/17/19    PT Start Time  0945    PT Stop Time  1025    PT Time Calculation (min)  40 min    Activity Tolerance  Patient tolerated treatment well    Behavior During Therapy  [facility] for tasks assessed/performed       Past Medical History:  Diagnosis Date  . GERD (gastroesophageal reflux disease)     Past Surgical History:  Procedure Laterality Date  . FOOT SURGERY Right    had accessory bone removed.    There were no vitals filed for this visit.  Subjective Assessment - 02/05/19 0953    Subjective  COVID-19 screen performed prior to patient entering clinic. Patient sore after last treatment    Pertinent History  Hypothroidism.  Right foot surgery.    How long can you walk comfortably?  Short distances.    Currently in Pain?  Yes    Pain Score  8     Pain Location  Back    Pain Orientation  Left    Pain Descriptors / Indicators  Discomfort;Aching    Pain Type  Acute pain    Pain Onset  More than a month ago    Pain Frequency  Constant    Aggravating Factors   prolong standing /walking    Pain Relieving Factors  at rest/sitting                       [facility] Adult [facility] Treatment/Exercise - 02/05/19 0001      Self-Care   Self-Care  ADL's;Lifting;Posture;Other Self-Care Comments    Other Self-Care Comments   HEP provided today      Exercises   Exercises  Lumbar      Lumbar Exercises: Standing   Row  Strengthening;Both;20 reps;Theraband    Theraband Level (Row)  Other (comment)    Row Limitations  orange XTS    Shoulder Extension   Strengthening;Both;20 reps;Theraband    Theraband Level (Shoulder Extension)  Other (comment)    Shoulder Extension Limitations  orange XTS      Lumbar Exercises: Supine   Ab Set  20 reps;5 seconds    Glut Set  20 reps;5 seconds    Bent Knee Raise  3 seconds   2x20   Straight Leg Raise  3 seconds   2x10     Lumbar Exercises: Sidelying   Other Sidelying Lumbar Exercises  positioal traction x7710min      Modalities   Modalities  Electrical Stimulation;Traction      Electrical Stimulation   Electrical Stimulation Goals  --   no ES per patient     Traction   Type of Traction  Other (comment)   unable to perform, did not want to be strapped in                 PT Long Term Goals - 02/03/19 1044      PT LONG TERM GOAL #1   Title  Independent with a HEP.    Time  6    Period  Weeks    Status  New      PT LONG TERM GOAL #2   Title  Walk a community distance with pain not > 3/10.    Time  6    Period  Weeks    Status  New      PT LONG TERM GOAL #3   Title  Eliminate left LE symptoms.    Time  6    Period  Weeks    Status  New      PT LONG TERM GOAL #4   Title  Perform ADL's with pain not > 3/10.    Time  6    Period  Weeks    Status  New            Plan - 02/05/19 1039    Clinical Impression Statement  Patient tolerated treatment fair due to pain level in back and down left LE to knee. Today educated patient on posture awareness techniques and gentle core activation progression with HEP provided. Patient did not want electrical stimulation due to it causing increased discomfort last treatment. Patient did not want to try mecanical traction due to being strappped in. Patient did very well with positional traction today. Current goals ongoing. Educated patient on core progression and posture with pain free activity to strengthen core and avoid any increased pain.    Examination-Activity Limitations  Locomotion Level;Other    Examination-Participation  Restrictions  Other    Stability/Clinical Decision Making  Evolving/Moderate complexity    Rehab Potential  Good    PT Frequency  2x / week    PT Duration  6 weeks    PT Treatment/Interventions  ADLs/Self Care Home Management;Cryotherapy;Electrical Stimulation;Ultrasound;Traction;Moist Heat;Therapeutic activities;Therapeutic exercise;Manual techniques;Patient/family education    PT Next Visit Plan  assess positional traction and cont with core progression as tolerated    Consulted and Agree with Plan of Care  Patient       Patient will benefit from skilled therapeutic intervention in order to improve the following deficits and impairments:  Pain, Decreased activity tolerance  Visit Diagnosis: Acute left-sided low back pain with left-sided sciatica     Problem List Patient Active Problem List   Diagnosis Date Noted  . Shift work sleep disorder 09/04/2017  . Pre-diabetes 09/04/2017  . Hypothyroidism 09/04/2017  . Cyst of epididymis determined by ultrasound 09/03/2017  . Hyperlipidemia 09/03/2017  . Adult BMI 35.0-35.9 kg/sq m 07/29/2017  . Elevated blood-pressure reading without diagnosis of hypertension 07/29/2017  . Plantar fasciitis 07/29/2017    Phillips Climes, PTA 02/05/2019, 10:44 AM  [facility] [place], [place], [address] Phone: [phone]   Fax:  [phone]  Name: Eduardo [name] MRN: [id number] Date of Birth: [birth year]

## 2019-02-10 ENCOUNTER — Encounter: Payer: Self-pay | Admitting: Sports Medicine

## 2019-02-10 ENCOUNTER — Other Ambulatory Visit: Payer: Self-pay

## 2019-02-10 ENCOUNTER — Ambulatory Visit: Payer: 59 | Admitting: *Deleted

## 2019-02-10 DIAGNOSIS — M5442 Lumbago with sciatica, left side: Secondary | ICD-10-CM

## 2019-02-10 NOTE — Therapy (Signed)
[facility] [address] [place], [place], [address] Phone: [phone]   Fax:  [phone]  Physical Therapy Treatment  Patient Details  Name: Eduardo [name] MRN: [id number] Date of Birth: 1970-09-14 Referring Provider (PT): Reino Bellisimothy Draper DO.   Encounter Date: 02/10/2019  PT End of Session - 02/10/19 0954    Visit Number  3    Number of Visits  12    Date for PT Re-Evaluation  03/17/19    PT Start Time  0945    PT Stop Time  1035    PT Time Calculation (min)  50 min       Past Medical History:  Diagnosis Date  . GERD (gastroesophageal reflux disease)     Past Surgical History:  Procedure Laterality Date  . FOOT SURGERY Right    had accessory bone removed.    There were no vitals filed for this visit.  Subjective Assessment - 02/10/19 0945    Subjective  COVID-19 screen performed prior to patient entering clinic. My LT leg is still hurting. Another injection tomorrow. Twitching in my LT toes now.    Pertinent History  Hypothroidism.  Right foot surgery.    How long can you walk comfortably?  Short distances.    Currently in Pain?  Yes    Pain Score  8     Pain Location  Back    Pain Orientation  Left    Pain Descriptors / Indicators  Discomfort    Pain Type  Acute pain    Pain Onset  More than a month ago                       [facility] Adult [facility] Treatment/Exercise - 02/10/19 0001      Exercises   Exercises  Lumbar      Lumbar Exercises: Prone   Other Prone Lumbar Exercises  prone position x 2mins, prone on elbows x 3mins mild knee pain, prone x 2 mins no knee pain, prone on elbows x 2 mins  no worse, 10 press ups LT LE same, x10 more  LT LE same no worse      Modalities   Modalities  Electrical Stimulation;Ultrasound;Moist Heat      Moist Heat Therapy   Number Minutes Moist Heat  10 Minutes    Moist Heat Location  Lumbar Spine      Ultrasound   Ultrasound Location  LT side LB paras    Ultrasound  Parameters  Prone  1.5 w/cm2 x 10 mins    Ultrasound Goals  Pain      Manual Therapy   Manual Therapy  Soft tissue mobilization    Soft tissue mobilization  STW with pt prone to LT LB paras                  PT Long Term Goals - 02/03/19 1044      PT LONG TERM GOAL #1   Title  Independent with a HEP.    Time  6    Period  Weeks    Status  New      PT LONG TERM GOAL #2   Title  Walk a community distance with pain not > 3/10.    Time  6    Period  Weeks    Status  New      PT LONG TERM GOAL #3   Title  Eliminate left LE symptoms.    Time  6  Period  Weeks    Status  New      PT LONG TERM GOAL #4   Title  Perform ADL's with pain not > 3/10.    Time  6    Period  Weeks    Status  New            Plan - 02/10/19 0071    Clinical Impression Statement  Pt arrived today doing about the same and reports no change after last Rx. He was able to perform prone progression exs with minimal change to LT LE symptoms. Pt did well with [place] and STW and reports feeling a little better than before session. Pt is to have an injection tomorrow  and f/u with PT on Thursday. His CC is to constant symptoms in LT LE.    Rehab Potential  Good    PT Frequency  2x / week    PT Duration  6 weeks    PT Treatment/Interventions  ADLs/Self Care Home Management;Cryotherapy;Electrical Stimulation;Ultrasound;Traction;Moist Heat;Therapeutic activities;Therapeutic exercise;Manual techniques;Patient/family education    PT Next Visit Plan  assess prone progression and cont with core progression as tolerated    Consulted and Agree with Plan of Care  Patient       Patient will benefit from skilled therapeutic intervention in order to improve the following deficits and impairments:     Visit Diagnosis: Acute left-sided low back pain with left-sided sciatica     Problem List Patient Active Problem List   Diagnosis Date Noted  . Shift work sleep disorder 09/04/2017  . Pre-diabetes  09/04/2017  . Hypothyroidism 09/04/2017  . Cyst of epididymis determined by ultrasound 09/03/2017  . Hyperlipidemia 09/03/2017  . Adult BMI 35.0-35.9 kg/sq m 07/29/2017  . Elevated blood-pressure reading without diagnosis of hypertension 07/29/2017  . Plantar fasciitis 07/29/2017    RAMSEUR,CHRIS, PTA 02/10/2019, 11:26 AM  [facility] [place], [place], [address] Phone: [phone]   Fax:  [phone]  Name: Eduardo [name] MRN: [id number] Date of Birth: 24-Jan-1971

## 2019-02-11 ENCOUNTER — Ambulatory Visit
Admission: RE | Admit: 2019-02-11 | Discharge: 2019-02-11 | Disposition: A | Discharge status: Home / Self Care | Payer: 59 | Source: Ambulatory Visit | Attending: Orthopedic Surgery | Admitting: Orthopedic Surgery

## 2019-02-11 DIAGNOSIS — M545 Low back pain, unspecified: Secondary | ICD-10-CM

## 2019-02-11 DIAGNOSIS — G8929 Other chronic pain: Secondary | ICD-10-CM

## 2019-02-11 MED ORDER — METHYLPREDNISOLONE ACETATE 40 MG/ML INJ SUSP (RADIOLOG
120.0000 mg | Freq: Once | INTRAMUSCULAR | Status: AC
  Administered 2019-02-11: 120 mg via EPIDURAL

## 2019-02-11 MED ORDER — IOPAMIDOL (ISOVUE-M 200) INJECTION 41%
1.0000 mL | Freq: Once | INTRAMUSCULAR | Status: AC
  Administered 2019-02-11: 1 mL via EPIDURAL

## 2019-02-11 NOTE — Discharge Instructions (Signed)

## 2019-02-12 ENCOUNTER — Ambulatory Visit: Payer: 59 | Admitting: Physical Therapy

## 2019-02-17 ENCOUNTER — Ambulatory Visit: Payer: 59 | Admitting: *Deleted

## 2019-02-17 ENCOUNTER — Other Ambulatory Visit: Payer: Self-pay

## 2019-02-17 ENCOUNTER — Encounter: Payer: Self-pay | Admitting: *Deleted

## 2019-02-17 DIAGNOSIS — M5442 Lumbago with sciatica, left side: Secondary | ICD-10-CM

## 2019-02-17 NOTE — Therapy (Signed)
[facility] [place], [place], [address] Phone: [phone]   Fax:  [phone]  Physical Therapy Treatment  Patient Details  Name: Eduardo [name] MRN: [id number] Date of Birth: 1971-02-16 Referring Provider (PT): Lilia Argue DO.   Encounter Date: 02/17/2019  PT End of Session - 02/17/19 0951    Visit Number  4    Number of Visits  12    Date for PT Re-Evaluation  03/17/19    PT Start Time  0945    PT Stop Time  8341   no estim with heat as per Pt. Only 2 units   PT Time Calculation (min)  45 min       Past Medical History:  Diagnosis Date  . GERD (gastroesophageal reflux disease)     Past Surgical History:  Procedure Laterality Date  . FOOT SURGERY Right    had accessory bone removed.    There were no vitals filed for this visit.  Subjective Assessment - 02/17/19 0946    Subjective  COVID-19 screen performed prior to patient entering clinic.I had my injection last Wednesday with no relief. F/u with my Ortho on Friday    Pertinent History  Hypothroidism.  Right foot surgery.    How long can you walk comfortably?  Short distances.    Currently in Pain?  Yes    Pain Score  8     Pain Location  Back    Pain Orientation  Left    Pain Descriptors / Indicators  Discomfort    Pain Type  Acute pain    Pain Onset  More than a month ago                       [facility] Adult PT Treatment/Exercise - 02/17/19 0001      Exercises   Exercises  Lumbar      Modalities   Modalities  Electrical Stimulation;Ultrasound;Moist Heat      Moist Heat Therapy   Number Minutes Moist Heat  15 Minutes    Moist Heat Location  Lumbar Spine      Ultrasound   Ultrasound Location  LT side LB paras    Ultrasound Parameters  prone 1.5 w/cm2 x 12 mins      Manual Therapy   Manual Therapy  Soft tissue mobilization    Soft tissue mobilization  STW with pt prone to LT LB paras x 15 mins                   PT Long Term Goals - 02/03/19 1044      PT LONG TERM GOAL #1   Title  Independent with a HEP.    Time  6    Period  Weeks    Status  New      PT LONG TERM GOAL #2   Title  Walk a community distance with pain not > 3/10.    Time  6    Period  Weeks    Status  New      PT LONG TERM GOAL #3   Title  Eliminate left LE symptoms.    Time  6    Period  Weeks    Status  New      PT LONG TERM GOAL #4   Title  Perform ADL's with pain not > 3/10.    Time  6    Period  Weeks    Status  New  Plan - 02/17/19 1027    Clinical Impression Statement  Pt arrived today doing fair after injection, but still with pain/ numbness in LT LE. He did well with Rx today with decreased tension/tightness in LT LB paras and QL. Normal modality response after removal. No e-stim as per Pt.    Examination-Participation Restrictions  Other    Stability/Clinical Decision Making  Evolving/Moderate complexity    Rehab Potential  Good    PT Frequency  2x / week    PT Duration  6 weeks    PT Treatment/Interventions  ADLs/Self Care Home Management;Cryotherapy;Electrical Stimulation;Ultrasound;Traction;Moist Heat;Therapeutic activities;Therapeutic exercise;Manual techniques;Patient/family education    Consulted and Agree with Plan of Care  Patient       Patient will benefit from skilled therapeutic intervention in order to improve the following deficits and impairments:  Pain, Decreased activity tolerance  Visit Diagnosis: Acute left-sided low back pain with left-sided sciatica     Problem List Patient Active Problem List   Diagnosis Date Noted  . Shift work sleep disorder 09/04/2017  . Pre-diabetes 09/04/2017  . Hypothyroidism 09/04/2017  . Cyst of epididymis determined by ultrasound 09/03/2017  . Hyperlipidemia 09/03/2017  . Adult BMI 35.0-35.9 kg/sq m 07/29/2017  . Elevated blood-pressure reading without diagnosis of hypertension 07/29/2017  . Plantar  fasciitis 07/29/2017    Khalaya Mcgurn,CHRIS, PTA 02/17/2019, 10:47 AM  [facility] [address] [place], [place], [address] Phone: [phone]   Fax:  [phone]  Name: Eduardo [name] MRN: [id number] Date of Birth: July 23, 1970

## 2019-02-19 ENCOUNTER — Other Ambulatory Visit: Payer: Self-pay

## 2019-02-19 ENCOUNTER — Ambulatory Visit: Payer: 59 | Admitting: *Deleted

## 2019-02-19 DIAGNOSIS — M5442 Lumbago with sciatica, left side: Secondary | ICD-10-CM | POA: Diagnosis not present

## 2019-02-19 NOTE — Therapy (Addendum)
[facility] [place], [place], [address] Phone: [phone]   Fax:  [phone]  Physical Therapy Treatment  Patient Details  Name: Eduardo [name] MRN: [id number] Date of Birth: 20-May-1971 Referring Provider (PT): Lilia Argue DO.   Encounter Date: 02/19/2019  PT End of Session - 02/19/19 0957    Visit Number  5    Number of Visits  12    Date for PT Re-Evaluation  03/17/19    PT Start Time  0945    PT Stop Time  6237   only 2 units. Pt denied Estim   PT Time Calculation (min)  45 min       Past Medical History:  Diagnosis Date  . GERD (gastroesophageal reflux disease)     Past Surgical History:  Procedure Laterality Date  . FOOT SURGERY Right    had accessory bone removed.    There were no vitals filed for this visit.  Subjective Assessment - 02/19/19 0952    Subjective  COVID-19 screen performed prior to patient entering clinic.I had my injection last Wednesday with no relief. F/u with my Ortho tomorrow 8:30    Pertinent History  Hypothroidism.  Right foot surgery.    How long can you walk comfortably?  Short distances.    Currently in Pain?  Yes    Pain Score  7     Pain Location  Leg    Pain Orientation  Left    Pain Descriptors / Indicators  Burning;Constant;Stabbing    Pain Type  Acute pain    Pain Onset  More than a month ago                       [facility] Adult PT Treatment/Exercise - 02/19/19 0001      Exercises   Exercises  Lumbar      Modalities   Modalities  Ultrasound      Moist Heat Therapy   Number Minutes Moist Heat  15 Minutes    Moist Heat Location  Lumbar Spine      Ultrasound   Ultrasound Location  LT side LB paras    Ultrasound Parameters  prone 1.5 w/cm2   x 12 mins    Ultrasound Goals  Pain      Manual Therapy   Manual Therapy  Soft tissue mobilization    Soft tissue mobilization  STW with pt prone to LT LB paras x 15 mins                   PT Long Term Goals - 02/19/19 1025      PT LONG TERM GOAL #1   Title  Independent with a HEP.    Period  Weeks    Status  On-going      PT LONG TERM GOAL #2   Title  Walk a community distance with pain not > 3/10.    Time  6    Period  Weeks    Status  On-going      PT LONG TERM GOAL #3   Title  Eliminate left LE symptoms.    Period  Weeks            Plan - 02/19/19 1419    Clinical Impression Statement  Pt arrived today with minimal change to LT LE symptoms. He reports that has constant  paresthesia in LT LE down to below his knee with intermittent stabbing pains at times. Minimal progression  at this time. LTGs are ongoing.    Examination-Activity Limitations  Locomotion Level;Other    Examination-Participation Restrictions  Other    Stability/Clinical Decision Making  Evolving/Moderate complexity    Rehab Potential  Good    PT Frequency  2x / week    PT Duration  6 weeks    PT Treatment/Interventions  ADLs/Self Care Home Management;Cryotherapy;Electrical Stimulation;Ultrasound;Traction;Moist Heat;Therapeutic activities;Therapeutic exercise;Manual techniques;Patient/family education    PT Next Visit Plan  To MD tomorow. Send MD note       Patient will benefit from skilled therapeutic intervention in order to improve the following deficits and impairments:  Pain, Decreased activity tolerance  Visit Diagnosis: Acute left-sided low back pain with left-sided sciatica     Problem List Patient Active Problem List   Diagnosis Date Noted  . Shift work sleep disorder 09/04/2017  . Pre-diabetes 09/04/2017  . Hypothyroidism 09/04/2017  . Cyst of epididymis determined by ultrasound 09/03/2017  . Hyperlipidemia 09/03/2017  . Adult BMI 35.0-35.9 kg/sq m 07/29/2017  . Elevated blood-pressure reading without diagnosis of hypertension 07/29/2017  . Plantar fasciitis 07/29/2017    RAMSEUR,CHRIS,PTA 02/19/2019, 2:32 PM  [facility] [place], [place], [address] Phone: [phone]   Fax:  [phone]  Name: Eduardo [name] MRN: [id number] Date of Birth: 1970-10-13  PHYSICAL THERAPY DISCHARGE SUMMARY  Visits from Start of Care: 5.  Current functional level related to goals / functional outcomes: See above.   Remaining deficits: See below.   Education / Equipment: HEP. Plan: Patient agrees to discharge.  Patient goals were not met. Patient is being discharged due to not returning since the last visit.  ?????         Mali Applegate MPT

## 2019-02-24 ENCOUNTER — Other Ambulatory Visit: Payer: Self-pay | Admitting: Orthopedic Surgery

## 2019-03-05 ENCOUNTER — Encounter: Payer: Self-pay | Admitting: Physician Assistant

## 2019-03-05 DIAGNOSIS — M5442 Lumbago with sciatica, left side: Secondary | ICD-10-CM

## 2019-03-05 MED ORDER — GABAPENTIN 400 MG PO CAPS: 400.0000 mg | ORAL_CAPSULE | Freq: Two times a day (BID) | ORAL | 0 refills | Status: DC

## 2019-03-17 NOTE — Progress Notes (Signed)
[facility] [address], [place] - [address] [place] [address] Phone: [phone] Fax: [phone]      Your procedure is scheduled on October 28th, 2020.  Report to [facility] Main Entrance "A" at 8:50 A.M., and check in at the Admitting office.   Call this number if you have problems the morning of surgery:  [phone]  Call [phone] if you have any questions prior to your surgery date Monday-Friday 8am-4pm    Remember:  Do not eat after midnight the night before your surgery  You may drink clear liquids until 7:50AM the morning of your surgery.    Clear liquids allowed are: Water, Non-Citrus Juices (without pulp), Carbonated Beverages, Clear Tea, Black Coffee Only, and Gatorade   Please complete your PRE-SURGERY ENSURE that was provided to you by 7:50AM the morning of surgery.  Please, if able, drink it in one setting. DO NOT SIP.   Take these medicines the morning of surgery with A SIP OF WATER :  Gabapentin (Neurontin) Hydrocodone-acetaminophen (Norco) - if needed Levothyroxine (Synthroid) Pravastatin (Pravachol) Tizanidine (Zanaflex) - if needed  7 days prior to surgery STOP taking any Aspirin (unless otherwise instructed by your surgeon), Aleve, Naproxen, Ibuprofen, Motrin, Advil, Goody's, BC's, all herbal medications, fish oil, and all vitamins.    The Morning of Surgery  Do not wear jewelry, make-up or nail polish.  Do not wear lotions, powders, or perfumes/colognes, or deodorant  Do not shave 48 hours prior to surgery.  Men may shave face and neck.  Do not bring valuables to the hospital.  [facility] is not responsible for any belongings or valuables.  If you are a smoker, DO NOT Smoke 24 hours prior to surgery IF you wear a CPAP at night please bring your mask, tubing, and machine the morning of surgery   Remember that you must have someone to transport you home after your surgery, and remain with you for 24 hours  if you are discharged the same day.   Contacts, glasses, hearing aids, dentures or bridgework may not be worn into surgery.    Leave your suitcase in the car.  After surgery it may be brought to your room.  For patients admitted to the hospital, discharge time will be determined by your treatment team.  Patients discharged the day of surgery will not be allowed to drive home.    Special instructions:   [place]- Preparing For Surgery  Before surgery, you can play an important role. Because skin is not sterile, your skin needs to be as free of germs as possible. You can reduce the number of germs on your skin by washing with CHG (chlorahexidine gluconate) Soap before surgery.  CHG is an antiseptic cleaner which kills germs and bonds with the skin to continue killing germs even after washing.    Oral Hygiene is also important to reduce your risk of infection.  Remember - BRUSH YOUR TEETH THE MORNING OF SURGERY WITH YOUR REGULAR TOOTHPASTE  Please do not use if you have an allergy to CHG or antibacterial soaps. If your skin becomes reddened/irritated stop using the CHG.  Do not shave (including legs and underarms) for at least 48 hours prior to first CHG shower. It is OK to shave your face.  Please follow these instructions carefully.   1. Shower the NIGHT BEFORE SURGERY and the MORNING OF SURGERY with CHG Soap.   2. If you chose to wash your hair, wash  your hair first as usual with your normal shampoo.  3. After you shampoo, rinse your hair and body thoroughly to remove the shampoo.  4. Use CHG as you would any other liquid soap. You can apply CHG directly to the skin and wash gently with a scrungie or a clean washcloth.   5. Apply the CHG Soap to your body ONLY FROM THE NECK DOWN.  Do not use on open wounds or open sores. Avoid contact with your eyes, ears, mouth and genitals (private parts). Wash Face and genitals (private parts)  with your normal soap.   6. Wash thoroughly,  paying special attention to the area where your surgery will be performed.  7. Thoroughly rinse your body with warm water from the neck down.  8. DO NOT shower/wash with your normal soap after using and rinsing off the CHG Soap.  9. Pat yourself dry with a CLEAN TOWEL.  10. Wear CLEAN PAJAMAS to bed the night before surgery, wear comfortable clothes the morning of surgery  11. Place CLEAN SHEETS on your bed the night of your first shower and DO NOT SLEEP WITH PETS.    Day of Surgery:  Do not apply any deodorants/lotions. Please shower the morning of surgery with the CHG soap  Please wear clean clothes to the hospital/surgery center.   Remember to brush your teeth WITH YOUR REGULAR TOOTHPASTE.   Please read over the following fact sheets that you were given.

## 2019-03-18 ENCOUNTER — Encounter (HOSPITAL_COMMUNITY)
Admission: RE | Admit: 2019-03-18 | Discharge: 2019-03-18 | Disposition: A | Discharge status: Home / Self Care | Payer: 59 | Source: Ambulatory Visit | Attending: Orthopedic Surgery | Admitting: Orthopedic Surgery

## 2019-03-18 ENCOUNTER — Encounter (HOSPITAL_COMMUNITY): Payer: Self-pay

## 2019-03-18 ENCOUNTER — Other Ambulatory Visit: Payer: Self-pay

## 2019-03-18 DIAGNOSIS — Z01812 Encounter for preprocedural laboratory examination: Secondary | ICD-10-CM | POA: Insufficient documentation

## 2019-03-18 HISTORY — DX: Unspecified osteoarthritis, unspecified site: M19.90

## 2019-03-18 HISTORY — DX: Hypothyroidism, unspecified: E03.9

## 2019-03-18 LAB — CBC WITH DIFFERENTIAL/PLATELET
Abs Immature Granulocytes: 0.01 10*3/uL (ref 0.00–0.07)
Basophils Absolute: 0.1 10*3/uL (ref 0.0–0.1)
Basophils Relative: 1 %
Eosinophils Absolute: 0.1 10*3/uL (ref 0.0–0.5)
Eosinophils Relative: 2 %
HCT: 45.5 % (ref 39.0–52.0)
Hemoglobin: 14.9 g/dL (ref 13.0–17.0)
Immature Granulocytes: 0 %
Lymphocytes Relative: 37 %
Lymphs Abs: 2.5 10*3/uL (ref 0.7–4.0)
MCH: 30.2 pg (ref 26.0–34.0)
MCHC: 32.7 g/dL (ref 30.0–36.0)
MCV: 92.3 fL (ref 80.0–100.0)
Monocytes Absolute: 0.6 10*3/uL (ref 0.1–1.0)
Monocytes Relative: 9 %
Neutro Abs: 3.4 10*3/uL (ref 1.7–7.7)
Neutrophils Relative %: 51 %
Platelets: 216 10*3/uL (ref 150–400)
RBC: 4.93 MIL/uL (ref 4.22–5.81)
RDW: 13.1 % (ref 11.5–15.5)
WBC: 6.6 10*3/uL (ref 4.0–10.5)
nRBC: 0 % (ref 0.0–0.2)

## 2019-03-18 LAB — COMPREHENSIVE METABOLIC PANEL
ALT: 56 U/L — ABNORMAL HIGH (ref 0–44)
AST: 26 U/L (ref 15–41)
Albumin: 4 g/dL (ref 3.5–5.0)
Alkaline Phosphatase: 81 U/L (ref 38–126)
Anion gap: 10 (ref 5–15)
BUN: 7 mg/dL (ref 6–20)
CO2: 25 mmol/L (ref 22–32)
Calcium: 9.3 mg/dL (ref 8.9–10.3)
Chloride: 104 mmol/L (ref 98–111)
Creatinine, Ser: 0.98 mg/dL (ref 0.61–1.24)
GFR calc Af Amer: >60 mL/min (ref >60)
GFR calc non Af Amer: >60 mL/min (ref >60)
Glucose, Bld: 111 mg/dL — ABNORMAL HIGH (ref 70–99)
Potassium: 4.1 mmol/L (ref 3.5–5.1)
Sodium: 139 mmol/L (ref 135–145)
Total Bilirubin: 0.5 mg/dL (ref 0.3–1.2)
Total Protein: 6.7 g/dL (ref 6.5–8.1)

## 2019-03-18 LAB — TYPE AND SCREEN
ABO/RH(D): O POS
Antibody Screen: NEGATIVE

## 2019-03-18 LAB — APTT: aPTT: 27 seconds (ref 24–36)

## 2019-03-18 LAB — ABO/RH: ABO/RH(D): O POS

## 2019-03-18 LAB — URINALYSIS, ROUTINE W REFLEX MICROSCOPIC
Bilirubin Urine: NEGATIVE
Glucose, UA: NEGATIVE mg/dL
Hgb urine dipstick: NEGATIVE
Ketones, ur: NEGATIVE mg/dL
Leukocytes,Ua: NEGATIVE
Nitrite: NEGATIVE
Protein, ur: NEGATIVE mg/dL
Specific Gravity, Urine: 1.02 (ref 1.005–1.030)
pH: 5 (ref 5.0–8.0)

## 2019-03-18 LAB — PROTIME-INR
INR: 0.9 (ref 0.8–1.2)
Prothrombin Time: 12 seconds (ref 11.4–15.2)

## 2019-03-18 LAB — SURGICAL PCR SCREEN
MRSA, PCR: NEGATIVE
Staphylococcus aureus: NEGATIVE

## 2019-03-18 NOTE — Progress Notes (Signed)
   03/18/19 1044  OBSTRUCTIVE SLEEP APNEA  Have you ever been diagnosed with sleep apnea through a sleep study? No  Do you snore loudly (loud enough to be heard through closed doors)?  1  Do you often feel tired, fatigued, or sleepy during the daytime (such as falling asleep during driving or talking to someone)? 0  Has anyone observed you stop breathing during your sleep? 0  Do you have, or are you being treated for high blood pressure? 1  BMI more than 35 kg/m2? 1  Age > 50 (1-yes) 0  Neck circumference greater than:Male 16 inches or larger, Male 17inches or larger? 1  Male Gender (Yes=1) 1  Obstructive Sleep Apnea Score 5

## 2019-03-18 NOTE — Progress Notes (Signed)
PCP - Particia Nearing, PA-C Cardiologist - denies  Chest x-ray - N/A EKG - N/A Stress Test - denies ECHO - denies Cardiac Cath - denies  Sleep Study - denies; scored 5 on OSA assessment; note sent to Particia Nearing, PA-C  CPAP - N/A   Blood Thinner Instructions: N/A Aspirin Instructions: N/A  ERAS Protcol - can drink clear liquids until 0750 DOS PRE-SURGERY Ensure or G2- provided  COVID TEST- scheduled 03/23/19  Coronavirus Screening  Have you experienced the following symptoms:  Cough yes/no: No Fever (>100.83F)  yes/no: No Runny nose yes/no: No Sore throat yes/no: No Difficulty breathing/shortness of breath  yes/no: No  Have you or a family member traveled in the last 14 days and where? yes/no: No   If the patient indicates "YES" to the above questions, their PAT will be rescheduled to limit the exposure to others and, the surgeon will be notified. THE PATIENT WILL NEED TO BE ASYMPTOMATIC FOR 14 DAYS.   If the patient is not experiencing any of these symptoms, the PAT nurse will instruct them to NOT bring anyone with them to their appointment since they may have these symptoms or traveled as well.   Please remind your patients and families that hospital visitation restrictions are in effect and the importance of the restrictions.   Anesthesia review: No  Patient denies shortness of breath, fever, cough and chest pain at PAT appointment   All instructions explained to the patient, with a verbal understanding of the material. Patient agrees to go over the instructions while at home for a better understanding. Patient also instructed to self quarantine after being tested for COVID-19. The opportunity to ask questions was provided.

## 2019-03-23 ENCOUNTER — Other Ambulatory Visit (HOSPITAL_COMMUNITY)
Admission: RE | Admit: 2019-03-23 | Discharge: 2019-03-23 | Disposition: A | Discharge status: Home / Self Care | Payer: 59 | Source: Ambulatory Visit | Attending: Orthopedic Surgery | Admitting: Orthopedic Surgery

## 2019-03-23 DIAGNOSIS — Z20828 Contact with and (suspected) exposure to other viral communicable diseases: Secondary | ICD-10-CM | POA: Insufficient documentation

## 2019-03-23 DIAGNOSIS — Z01812 Encounter for preprocedural laboratory examination: Secondary | ICD-10-CM | POA: Diagnosis not present

## 2019-03-23 LAB — SARS CORONAVIRUS 2 (TAT 6-24 HRS): SARS Coronavirus 2: NEGATIVE

## 2019-03-24 MED ORDER — DEXTROSE 5 % IV SOLN
3.0000 g | INTRAVENOUS | Status: AC
  Administered 2019-03-25: 3 g via INTRAVENOUS
  Filled 2019-03-24: qty 3

## 2019-03-24 NOTE — Anesthesia Preprocedure Evaluation (Addendum)
Anesthesia Evaluation  Patient identified by MRN, date of birth, ID band Patient awake    Reviewed: Allergy & Precautions, NPO status , Patient's Chart, lab work & pertinent test results  Airway Mallampati: II  TM Distance: >3 FB Neck ROM: Full    Dental no notable dental hx. (+) Teeth Intact, Dental Advisory Given,    Pulmonary former smoker,  Former smoker, quit 2007   Pulmonary exam normal breath sounds clear to auscultation       Cardiovascular negative cardio ROS Normal cardiovascular exam Rhythm:Regular Rate:Normal     Neuro/Psych negative neurological ROS  negative psych ROS   GI/Hepatic Neg liver ROS, GERD  ,  Endo/Other  Hypothyroidism Obesity BMI 37  Renal/GU negative Renal ROS  negative genitourinary   Musculoskeletal  (+) Arthritis , Osteoarthritis,    Abdominal Normal abdominal exam  (+)   Peds negative pediatric ROS (+)  Hematology negative hematology ROS (+)   Anesthesia Other Findings Pain meds: gabapentin Prefers hydrocodone to oxycodone  HLD  Reproductive/Obstetrics negative OB ROS                            Anesthesia Physical Anesthesia Plan  ASA: III  Anesthesia Plan: General   Post-op Pain Management:    Induction: Intravenous  PONV Risk Score and Plan: 3 and Ondansetron, Dexamethasone, Midazolam and Treatment may vary due to age or medical condition  Airway Management Planned: Oral ETT  Additional Equipment: None  Intra-op Plan:   Post-operative Plan: Extubation in OR  Informed Consent: I have reviewed the patients History and Physical, chart, labs and discussed the procedure including the risks, benefits and alternatives for the proposed anesthesia with the patient or authorized representative who has indicated his/her understanding and acceptance.     Dental advisory given  Plan Discussed with: CRNA  Anesthesia Plan Comments:          Anesthesia Quick Evaluation

## 2019-03-25 ENCOUNTER — Other Ambulatory Visit: Payer: Self-pay

## 2019-03-25 ENCOUNTER — Ambulatory Visit (HOSPITAL_COMMUNITY)
Admission: RE | Admit: 2019-03-25 | Discharge: 2019-03-25 | Disposition: A | Discharge status: Home / Self Care | Payer: 59 | Attending: Orthopedic Surgery | Admitting: Orthopedic Surgery

## 2019-03-25 ENCOUNTER — Ambulatory Visit (HOSPITAL_COMMUNITY): Payer: 59

## 2019-03-25 ENCOUNTER — Ambulatory Visit (HOSPITAL_COMMUNITY): Payer: 59 | Admitting: Anesthesiology

## 2019-03-25 ENCOUNTER — Encounter (HOSPITAL_COMMUNITY): Payer: Self-pay

## 2019-03-25 ENCOUNTER — Encounter (HOSPITAL_COMMUNITY)
Admission: RE | Disposition: A | Discharge status: Home / Self Care | Payer: Self-pay | Source: Home / Self Care | Attending: Orthopedic Surgery

## 2019-03-25 DIAGNOSIS — Z6837 Body mass index (BMI) 37.0-37.9, adult: Secondary | ICD-10-CM | POA: Insufficient documentation

## 2019-03-25 DIAGNOSIS — E669 Obesity, unspecified: Secondary | ICD-10-CM | POA: Diagnosis not present

## 2019-03-25 DIAGNOSIS — M5116 Intervertebral disc disorders with radiculopathy, lumbar region: Secondary | ICD-10-CM | POA: Insufficient documentation

## 2019-03-25 DIAGNOSIS — R7303 Prediabetes: Secondary | ICD-10-CM | POA: Insufficient documentation

## 2019-03-25 DIAGNOSIS — K219 Gastro-esophageal reflux disease without esophagitis: Secondary | ICD-10-CM | POA: Diagnosis not present

## 2019-03-25 DIAGNOSIS — Z79899 Other long term (current) drug therapy: Secondary | ICD-10-CM | POA: Insufficient documentation

## 2019-03-25 DIAGNOSIS — M1990 Unspecified osteoarthritis, unspecified site: Secondary | ICD-10-CM | POA: Diagnosis not present

## 2019-03-25 DIAGNOSIS — Z79890 Hormone replacement therapy (postmenopausal): Secondary | ICD-10-CM | POA: Diagnosis not present

## 2019-03-25 DIAGNOSIS — Z87891 Personal history of nicotine dependence: Secondary | ICD-10-CM | POA: Insufficient documentation

## 2019-03-25 DIAGNOSIS — E039 Hypothyroidism, unspecified: Secondary | ICD-10-CM | POA: Insufficient documentation

## 2019-03-25 DIAGNOSIS — Z419 Encounter for procedure for purposes other than remedying health state, unspecified: Secondary | ICD-10-CM

## 2019-03-25 HISTORY — PX: LUMBAR LAMINECTOMY/DECOMPRESSION MICRODISCECTOMY: SHX5026

## 2019-03-25 SURGERY — LUMBAR LAMINECTOMY/DECOMPRESSION MICRODISCECTOMY
Anesthesia: General

## 2019-03-25 MED ORDER — PHENYLEPHRINE 40 MCG/ML (10ML) SYRINGE FOR IV PUSH (FOR BLOOD PRESSURE SUPPORT)
PREFILLED_SYRINGE | INTRAVENOUS | Status: DC | PRN
  Administered 2019-03-25: 120 ug via INTRAVENOUS
  Administered 2019-03-25: 80 ug via INTRAVENOUS

## 2019-03-25 MED ORDER — ROCURONIUM BROMIDE 10 MG/ML (PF) SYRINGE
PREFILLED_SYRINGE | INTRAVENOUS | Status: AC
  Filled 2019-03-25: qty 10

## 2019-03-25 MED ORDER — BUPIVACAINE-EPINEPHRINE 0.25% -1:200000 IJ SOLN
INTRAMUSCULAR | Status: DC | PRN
  Administered 2019-03-25: 8 mL

## 2019-03-25 MED ORDER — ACETAMINOPHEN 500 MG PO TABS
1000.0000 mg | ORAL_TABLET | Freq: Once | ORAL | Status: AC
  Administered 2019-03-25: 1000 mg via ORAL
  Filled 2019-03-25: qty 2

## 2019-03-25 MED ORDER — MEPERIDINE HCL 25 MG/ML IJ SOLN: 6.2500 mg | INTRAMUSCULAR | Status: DC | PRN

## 2019-03-25 MED ORDER — LIDOCAINE 2% (20 MG/ML) 5 ML SYRINGE
INTRAMUSCULAR | Status: DC | PRN
  Administered 2019-03-25: 60 mg via INTRAVENOUS
  Administered 2019-03-25: 40 mg via INTRAVENOUS

## 2019-03-25 MED ORDER — OXYCODONE HCL 5 MG PO TABS: 5.0000 mg | ORAL_TABLET | Freq: Once | ORAL | Status: DC | PRN

## 2019-03-25 MED ORDER — FENTANYL CITRATE (PF) 100 MCG/2ML IJ SOLN
INTRAMUSCULAR | Status: DC | PRN
  Administered 2019-03-25: 100 ug via INTRAVENOUS
  Administered 2019-03-25 (×3): 50 ug via INTRAVENOUS

## 2019-03-25 MED ORDER — PROMETHAZINE HCL 25 MG/ML IJ SOLN
6.2500 mg | INTRAMUSCULAR | Status: AC | PRN
  Administered 2019-03-25 (×2): 6.25 mg via INTRAVENOUS

## 2019-03-25 MED ORDER — ONDANSETRON HCL 4 MG/2ML IJ SOLN
INTRAMUSCULAR | Status: AC
  Filled 2019-03-25: qty 2

## 2019-03-25 MED ORDER — OXYCODONE HCL 5 MG/5ML PO SOLN: 5.0000 mg | Freq: Once | ORAL | Status: DC | PRN

## 2019-03-25 MED ORDER — KETOROLAC TROMETHAMINE 30 MG/ML IJ SOLN: 30.0000 mg | Freq: Once | INTRAMUSCULAR | Status: DC | PRN

## 2019-03-25 MED ORDER — DIAZEPAM 5 MG PO TABS: 5.0000 mg | ORAL_TABLET | Freq: Three times a day (TID) | ORAL | 0 refills | Status: AC | PRN

## 2019-03-25 MED ORDER — PROPOFOL 10 MG/ML IV BOLUS
INTRAVENOUS | Status: AC
  Filled 2019-03-25: qty 40

## 2019-03-25 MED ORDER — LACTATED RINGERS IV SOLN
INTRAVENOUS | Status: DC
  Administered 2019-03-25 (×2): via INTRAVENOUS

## 2019-03-25 MED ORDER — SUGAMMADEX SODIUM 500 MG/5ML IV SOLN
INTRAVENOUS | Status: AC
  Filled 2019-03-25: qty 5

## 2019-03-25 MED ORDER — PHENYLEPHRINE HCL-NACL 10-0.9 MG/250ML-% IV SOLN
INTRAVENOUS | Status: DC | PRN
  Administered 2019-03-25: 50 ug/min via INTRAVENOUS

## 2019-03-25 MED ORDER — METHYLENE BLUE 0.5 % INJ SOLN
INTRAVENOUS | Status: DC | PRN
  Administered 2019-03-25: .5 mL

## 2019-03-25 MED ORDER — METHYLENE BLUE 0.5 % INJ SOLN
INTRAVENOUS | Status: AC
  Filled 2019-03-25: qty 10

## 2019-03-25 MED ORDER — HYDROMORPHONE HCL 1 MG/ML IJ SOLN: 0.2500 mg | INTRAMUSCULAR | Status: DC | PRN

## 2019-03-25 MED ORDER — ROCURONIUM BROMIDE 50 MG/5ML IV SOSY
PREFILLED_SYRINGE | INTRAVENOUS | Status: DC | PRN
  Administered 2019-03-25: 50 mg via INTRAVENOUS
  Administered 2019-03-25: 20 mg via INTRAVENOUS

## 2019-03-25 MED ORDER — ARTIFICIAL TEARS OPHTHALMIC OINT
TOPICAL_OINTMENT | OPHTHALMIC | Status: AC
  Filled 2019-03-25: qty 3.5

## 2019-03-25 MED ORDER — MIDAZOLAM HCL 5 MG/5ML IJ SOLN
INTRAMUSCULAR | Status: DC | PRN
  Administered 2019-03-25: 2 mg via INTRAVENOUS

## 2019-03-25 MED ORDER — VANCOMYCIN HCL IN DEXTROSE 1-5 GM/200ML-% IV SOLN
INTRAVENOUS | Status: AC
  Filled 2019-03-25: qty 200

## 2019-03-25 MED ORDER — METHYLPREDNISOLONE ACETATE 40 MG/ML IJ SUSP
INTRAMUSCULAR | Status: AC
  Filled 2019-03-25: qty 1

## 2019-03-25 MED ORDER — PROPOFOL 10 MG/ML IV BOLUS
INTRAVENOUS | Status: DC | PRN
  Administered 2019-03-25: 200 mg via INTRAVENOUS

## 2019-03-25 MED ORDER — 0.9 % SODIUM CHLORIDE (POUR BTL) OPTIME
TOPICAL | Status: DC | PRN
  Administered 2019-03-25: 1000 mL

## 2019-03-25 MED ORDER — FENTANYL CITRATE (PF) 250 MCG/5ML IJ SOLN
INTRAMUSCULAR | Status: AC
  Filled 2019-03-25: qty 5

## 2019-03-25 MED ORDER — MIDAZOLAM HCL 2 MG/2ML IJ SOLN
INTRAMUSCULAR | Status: AC
  Filled 2019-03-25: qty 2

## 2019-03-25 MED ORDER — OXYCODONE-ACETAMINOPHEN 5-325 MG PO TABS: 1.0000 | ORAL_TABLET | ORAL | 0 refills | Status: AC | PRN

## 2019-03-25 MED ORDER — SUGAMMADEX SODIUM 500 MG/5ML IV SOLN
INTRAVENOUS | Status: DC | PRN
  Administered 2019-03-25: 250 mg via INTRAVENOUS

## 2019-03-25 MED ORDER — PROPOFOL 500 MG/50ML IV EMUL
INTRAVENOUS | Status: DC | PRN
  Administered 2019-03-25: 50 ug/kg/min via INTRAVENOUS

## 2019-03-25 MED ORDER — THROMBIN 20000 UNITS EX KIT
PACK | CUTANEOUS | Status: AC
  Filled 2019-03-25: qty 1

## 2019-03-25 MED ORDER — ONDANSETRON HCL 4 MG/2ML IJ SOLN
INTRAMUSCULAR | Status: DC | PRN
  Administered 2019-03-25: 4 mg via INTRAVENOUS

## 2019-03-25 MED ORDER — LIDOCAINE 2% (20 MG/ML) 5 ML SYRINGE
INTRAMUSCULAR | Status: AC
  Filled 2019-03-25: qty 5

## 2019-03-25 MED ORDER — THROMBIN 20000 UNITS EX SOLR
CUTANEOUS | Status: DC | PRN
  Administered 2019-03-25: 20 mL via TOPICAL

## 2019-03-25 MED ORDER — PROMETHAZINE HCL 25 MG/ML IJ SOLN
INTRAMUSCULAR | Status: AC
  Administered 2019-03-25: 6.25 mg via INTRAVENOUS
  Filled 2019-03-25: qty 1

## 2019-03-25 MED ORDER — PHENYLEPHRINE 40 MCG/ML (10ML) SYRINGE FOR IV PUSH (FOR BLOOD PRESSURE SUPPORT)
PREFILLED_SYRINGE | INTRAVENOUS | Status: AC
  Filled 2019-03-25: qty 10

## 2019-03-25 MED ORDER — POVIDONE-IODINE 7.5 % EX SOLN: Freq: Once | CUTANEOUS | Status: DC

## 2019-03-25 SURGICAL SUPPLY — 74 items
AGENT HMST KT MTR STRL THRMB (HEMOSTASIS)
APL SKNCLS STERI-STRIP NONHPOA (GAUZE/BANDAGES/DRESSINGS) ×1
BENZOIN TINCTURE PRP APPL 2/3 (GAUZE/BANDAGES/DRESSINGS) ×3 IMPLANT
BUR NEURO DRILL SOFT 3.0X3.8M (BURR) ×3 IMPLANT
BUR PRECISION FLUTE 5.0 (BURR) IMPLANT
CABLE BIPOLOR RESECTION CORD (MISCELLANEOUS) ×6 IMPLANT
CANISTER SUCT 3000ML PPV (MISCELLANEOUS) ×3 IMPLANT
CARTRIDGE OIL MAESTRO DRILL (MISCELLANEOUS) ×1 IMPLANT
CLOSURE WOUND 1/2 X4 (GAUZE/BANDAGES/DRESSINGS)
COVER SURGICAL LIGHT HANDLE (MISCELLANEOUS) ×3 IMPLANT
COVER WAND RF STERILE (DRAPES) IMPLANT
DIFFUSER DRILL AIR PNEUMATIC (MISCELLANEOUS) ×3 IMPLANT
DRAIN CHANNEL 15F RND FF W/TCR (WOUND CARE) IMPLANT
DRAPE POUCH INSTRU U-SHP 10X18 (DRAPES) ×6 IMPLANT
DRAPE SURG 17X23 STRL (DRAPES) ×12 IMPLANT
DURAPREP 26ML APPLICATOR (WOUND CARE) ×3 IMPLANT
ELECT BLADE 4.0 EZ CLEAN MEGAD (MISCELLANEOUS) ×3
ELECT CAUTERY BLADE 6.4 (BLADE) ×3 IMPLANT
ELECT REM PT RETURN 9FT ADLT (ELECTROSURGICAL) ×3
ELECTRODE BLDE 4.0 EZ CLN MEGD (MISCELLANEOUS) ×1 IMPLANT
ELECTRODE REM PT RTRN 9FT ADLT (ELECTROSURGICAL) ×1 IMPLANT
EVACUATOR SILICONE 100CC (DRAIN) IMPLANT
FILTER STRAW FLUID ASPIR (MISCELLANEOUS) ×3 IMPLANT
GAUZE 4X4 16PLY RFD (DISPOSABLE) ×6 IMPLANT
GAUZE SPONGE 4X4 12PLY STRL (GAUZE/BANDAGES/DRESSINGS) ×3 IMPLANT
GLOVE BIO SURGEON STRL SZ7 (GLOVE) ×3 IMPLANT
GLOVE BIO SURGEON STRL SZ8 (GLOVE) ×3 IMPLANT
GLOVE BIOGEL PI IND STRL 7.0 (GLOVE) ×1 IMPLANT
GLOVE BIOGEL PI IND STRL 8 (GLOVE) ×1 IMPLANT
GLOVE BIOGEL PI INDICATOR 7.0 (GLOVE) ×2
GLOVE BIOGEL PI INDICATOR 8 (GLOVE) ×2
GOWN STRL REUS W/ TWL LRG LVL3 (GOWN DISPOSABLE) ×1 IMPLANT
GOWN STRL REUS W/ TWL XL LVL3 (GOWN DISPOSABLE) ×2 IMPLANT
GOWN STRL REUS W/TWL LRG LVL3 (GOWN DISPOSABLE) ×3
GOWN STRL REUS W/TWL XL LVL3 (GOWN DISPOSABLE) ×6
IV CATH 14GX2 1/4 (CATHETERS) ×3 IMPLANT
KIT BASIN OR (CUSTOM PROCEDURE TRAY) ×3 IMPLANT
KIT POSITION SURG JACKSON T1 (MISCELLANEOUS) ×3 IMPLANT
KIT TURNOVER KIT B (KITS) ×3 IMPLANT
NDL 18GX1X1/2 (RX/OR ONLY) (NEEDLE) ×1 IMPLANT
NEEDLE 18GX1X1/2 (RX/OR ONLY) (NEEDLE) ×3 IMPLANT
NEEDLE 22X1 1/2 (OR ONLY) (NEEDLE) ×3 IMPLANT
NEEDLE HYPO 25GX1X1/2 BEV (NEEDLE) ×3 IMPLANT
NEEDLE SPNL 18GX3.5 QUINCKE PK (NEEDLE) ×6 IMPLANT
NS IRRIG 1000ML POUR BTL (IV SOLUTION) ×3 IMPLANT
OIL CARTRIDGE MAESTRO DRILL (MISCELLANEOUS) ×3
PACK LAMINECTOMY ORTHO (CUSTOM PROCEDURE TRAY) ×3 IMPLANT
PACK UNIVERSAL I (CUSTOM PROCEDURE TRAY) ×3 IMPLANT
PAD ARMBOARD 7.5X6 YLW CONV (MISCELLANEOUS) ×6 IMPLANT
PATTIES SURGICAL .5 X.5 (GAUZE/BANDAGES/DRESSINGS) IMPLANT
PATTIES SURGICAL .5 X1 (DISPOSABLE) ×3 IMPLANT
SPONGE INTESTINAL PEANUT (DISPOSABLE) ×3 IMPLANT
SPONGE SURGIFOAM ABS GEL 100 (HEMOSTASIS) ×3 IMPLANT
SPONGE SURGIFOAM ABS GEL SZ50 (HEMOSTASIS) ×3 IMPLANT
STRIP CLOSURE SKIN 1/2X4 (GAUZE/BANDAGES/DRESSINGS) IMPLANT
SURGIFLO W/THROMBIN 8M KIT (HEMOSTASIS) IMPLANT
SUT MNCRL AB 4-0 PS2 18 (SUTURE) ×3 IMPLANT
SUT VIC AB 0 CT1 18XCR BRD 8 (SUTURE) IMPLANT
SUT VIC AB 0 CT1 27 (SUTURE)
SUT VIC AB 0 CT1 27XBRD ANBCTR (SUTURE) IMPLANT
SUT VIC AB 0 CT1 8-18 (SUTURE)
SUT VIC AB 1 CT1 18XCR BRD 8 (SUTURE) ×1 IMPLANT
SUT VIC AB 1 CT1 8-18 (SUTURE) ×3
SUT VIC AB 2-0 CT2 18 VCP726D (SUTURE) ×3 IMPLANT
SYR 20ML LL LF (SYRINGE) ×3 IMPLANT
SYR BULB IRRIGATION 50ML (SYRINGE) ×3 IMPLANT
SYR CONTROL 10ML LL (SYRINGE) ×6 IMPLANT
SYR TB 1ML 25GX5/8 (SYRINGE) ×6 IMPLANT
SYR TB 1ML LUER SLIP (SYRINGE) ×6 IMPLANT
TAPE CLOTH SURG 4X10 WHT LF (GAUZE/BANDAGES/DRESSINGS) ×3 IMPLANT
TOWEL GREEN STERILE (TOWEL DISPOSABLE) ×3 IMPLANT
TOWEL GREEN STERILE FF (TOWEL DISPOSABLE) ×3 IMPLANT
WATER STERILE IRR 1000ML POUR (IV SOLUTION) ×3 IMPLANT
YANKAUER SUCT BULB TIP NO VENT (SUCTIONS) ×3 IMPLANT

## 2019-03-25 NOTE — Op Note (Signed)
PATIENT NAME: Eduardo [name]   MEDICAL RECORD NO.:   [id number]   DATE OF BIRTH: Mar 11, 1971   DATE OF PROCEDURE: 03/25/2019                              OPERATIVE REPORT     PREOPERATIVE DIAGNOSES: 1.  Left L2 radiculopathy 2.  Left-sided L2-3 far lateral disc herniation   POSTOPERATIVE DIAGNOSES: 1.  Left L2 radiculopathy 2.  Left-sided L2-3 far lateral disc herniation   PROCEDURES:   Left-sided L2-3 transpedicular discectomy and decompression, via a [place]ie approach   SURGEON:  Estill Bamberg, MD.   ASSISTANTJason Coop, PA-C.   ANESTHESIA:  General endotracheal anesthesia.   COMPLICATIONS:  None.   DISPOSITION:  Stable.   ESTIMATED BLOOD LOSS:  Minimal.   INDICATIONS FOR SURGERY:  Briefly, [name] is a pleasant 48 year old male who did present to me with ongoing pain in the left leg.  His symptoms were consistent with left L2 radiculopathy.  His MRI was not overly impressive, but did suggest an extraforaminal disc herniation on the left at L2-3.  He did get temporary relief with a left L2 selective nerve root block.  Given his ongoing symptoms, we did discuss the details of the surgery noted above. The patient was fully made aware of the risks of surgery, including the risk of recurrent herniation and the need for subsequent surgery, including the possibility of a subsequent diskectomy and/or fusion.   OPERATIVE DETAILS:  On 03/25/2019, the patient was brought to surgery and general endotracheal anesthesia was administered.  The patient was placed prone on a well-padded flat Jackson bed with a spinal frame.  Antibiotics were given.  The back was prepped and draped and a time-out procedure was performed.    At this point, a left-sided paramedian incision was made, 2-1/2 cm left of the midline.  The fascia was incised and the left-sided L2-3 facet joint, and the L2 lamina was noted. Liberally using AP and lateral fluoroscopy, I did sequentially placed dilators over  the lateral aspect of the L2 lamina, at its junction with the L2-3 facet joint.  Fluoroscopy did confirm appropriate placement of the dilators.  A self-retaining retractor was then placed over the dilators and attached to a rigid arm.  At this point, I did use a 3 mm high-speed bur to remove the lateral aspect of the L2 lamina and the L2-3 facet joint.  I did dissect anteriorly, and was able to identify the L2-3 intervertebral space, just over the L3 pedicle. I then carried my dissection more laterally.  With gentle superior and lateral mobilization of the left L2 nerve, a large disc fragment was noted, immediately beneath the L2 nerve.  This was uneventfully removed.  I did perform a small annulotomy in order to identify additional free disc fragments.  Smaller fragments were noted and were removed using micropituitary.  In this fashion, I was able to thoroughly decompress the exiting left L2 nerve.  Bleeding was then controlled using bipolar electrocautery in addition to surgery flow.  I was very pleased with the final decompression that I was able to accomplish.  At this point, the wound was copiously irrigated with normal saline.  The wound was then closed in layers using #1 Vicryl followed by 0 Vicryl, followed by 4-0 Monocryl. Benzoin and Steri-Strips were applied followed by a sterile dressing. All instrument counts were correct at the termination of the procedure.  Of note, Pricilla Holm was my assistant throughout surgery, and did aid in retraction, suctioning, and closure from start to finish.      Phylliss Bob, MD

## 2019-03-25 NOTE — Anesthesia Procedure Notes (Signed)
Procedure Name: Intubation Date/Time: 03/25/2019 11:44 AM Performed by: Pervis Hocking, DO Pre-anesthesia Checklist: Patient identified, Emergency Drugs available, Suction available, Patient being monitored and Timeout performed Patient Re-evaluated:Patient Re-evaluated prior to induction Oxygen Delivery Method: Circle system utilized Preoxygenation: Pre-oxygenation with 100% oxygen Induction Type: IV induction Ventilation: Mask ventilation without difficulty Laryngoscope Size: Mac and 4 Grade View: Grade II Tube type: Oral Tube size: 7.5 mm Number of attempts: 1 Placement Confirmation: ETT inserted through vocal cords under direct vision,  positive ETCO2 and breath sounds checked- equal and bilateral Secured at: 23 cm Tube secured with: Tape

## 2019-03-25 NOTE — Transfer of Care (Signed)
Immediate Anesthesia Transfer of Care Note  Patient: Eduardo [name]  Procedure(s) Performed: LUMBAR 2- LUMBAR 3 DECOMPRESSION (N/A )  Patient Location: PACU  Anesthesia Type:General  Level of Consciousness: awake, alert , oriented and sedated  Airway & Oxygen Therapy: Patient Spontanous Breathing and Patient connected to face mask oxygen  Post-op Assessment: Report given to RN, Post -op Vital signs reviewed and stable and Patient moving all extremities  Post vital signs: Reviewed and stable  Last Vitals:  Vitals Value Taken Time  BP 146/90 03/25/19 1449  Temp    Pulse 90 03/25/19 1457  Resp 13 03/25/19 1457  SpO2 100 % 03/25/19 1457  Vitals shown include unvalidated device data.  Last Pain:  Vitals:   03/25/19 0915  PainSc: 1          Complications: No apparent anesthesia complications

## 2019-03-25 NOTE — Anesthesia Postprocedure Evaluation (Signed)
Anesthesia Post Note  Patient: Eduardo [name]  Procedure(s) Performed: LUMBAR 2- LUMBAR 3 DECOMPRESSION (N/A )     Patient location during evaluation: PACU Anesthesia Type: General Level of consciousness: awake and alert, oriented and patient cooperative Pain management: pain level controlled Vital Signs Assessment: post-procedure vital signs reviewed and stable Respiratory status: spontaneous breathing, nonlabored ventilation and respiratory function stable Cardiovascular status: blood pressure returned to baseline and stable Postop Assessment: no apparent nausea or vomiting Anesthetic complications: no    Last Vitals:  Vitals:   03/25/19 1505 03/25/19 1520  BP: 131/74 139/85  Pulse: 93 89  Resp: 13 16  Temp:    SpO2: 98% 100%    Last Pain:  Vitals:   03/25/19 1520  PainSc: 0-No pain                 Jarome Matin Holiday Mcmenamin

## 2019-03-25 NOTE — H&P (Signed)
PREOPERATIVE H&P  Chief Complaint: Left leg pain  HPI: Eduardo [name] is a 48 y.o. male who presents with ongoing pain in the left leg  MRI reveals an extraforaminal disc herniation on the left at L2/3, compressing the left L2 nerve root  Patient has failed multiple forms of conservative care and continues to have pain (see office notes for additional details regarding the patient's full course of treatment)  Past Medical History:  Diagnosis Date  . Arthritis   . GERD (gastroesophageal reflux disease)   . Hypothyroidism    Past Surgical History:  Procedure Laterality Date  . FOOT SURGERY Right    had accessory bone removed.   Social History   Socioeconomic History  . Marital status: Married    Spouse name: Not on file  . Number of children: 0  . Years of education: Not on file  . Highest education level: Not on file  Occupational History  . Not on file  Social Needs  . Financial resource strain: Not on file  . Food insecurity    Worry: Not on file    Inability: Not on file  . Transportation needs    Medical: Not on file    Non-medical: Not on file  Tobacco Use  . Smoking status: Former Smoker    Quit date: 2007    Years since quitting: 13.8  . Smokeless tobacco: Never Used  Substance and Sexual Activity  . Alcohol use: Yes    Comment: occasionally  . Drug use: No  . Sexual activity: Yes  Lifestyle  . Physical activity    Days per week: Not on file    Minutes per session: Not on file  . Stress: Not on file  Relationships  . Social Musician on phone: Not on file    Gets together: Not on file    Attends religious service: Not on file    Active member of club or organization: Not on file    Attends meetings of clubs or organizations: Not on file    Relationship status: Not on file  Other Topics Concern  . Not on file  Social History Narrative  . Not on file   Family History  Adopted: Yes   No Known Allergies Prior to Admission  medications   Medication Sig Start Date End Date Taking? Authorizing Provider  gabapentin (NEURONTIN) 400 MG capsule Take 1 capsule (400 mg total) by mouth 2 (two) times daily. 03/05/19 04/04/19 Yes Dettinger, Elige Radon, MD  hydrOXYzine (ATARAX/VISTARIL) 10 MG tablet Take 1 tablet (10 mg total) by mouth at bedtime as needed. for sleep Patient taking differently: Take 10 mg by mouth at bedtime as needed (sleep).  12/30/18  Yes Delynn Flavin M, DO  levothyroxine (SYNTHROID) 88 MCG tablet Take 1 tablet (88 mcg total) by mouth daily. 12/31/18  Yes Gottschalk, Kathie Rhodes M, DO  pravastatin (PRAVACHOL) 40 MG tablet Take 1 tablet (40 mg total) by mouth daily. 12/30/18  Yes Delynn Flavin M, DO  HYDROcodone-acetaminophen (NORCO) 5-325 MG tablet Take 1 tablet by mouth every 4 (four) hours as needed for moderate pain. Patient not taking: Reported on 01/06/2019 01/05/19   Remus Loffler, PA-C  ibuprofen (ADVIL) 800 MG tablet Take 1 tablet (800 mg total) by mouth every 8 (eight) hours as needed. Patient not taking: Reported on 01/06/2019 01/05/19   Remus Loffler, PA-C  tiZANidine (ZANAFLEX) 4 MG tablet Take 0.5-1 tablets (2-4 mg total) by mouth every  8 (eight) hours as needed for muscle spasms. Patient not taking: Reported on 03/13/2019 12/30/18   Janora Norlander, DO     All other systems have been reviewed and were otherwise negative with the exception of those mentioned in the HPI and as above.  Physical Exam: Vitals:   03/25/19 0818  BP: (!) 167/86  Pulse: (!) 110  Resp: 20  Temp: 97.8 F (36.6 C)  SpO2: 100%    Body mass index is 37.31 kg/m.  General: Alert, no acute distress Cardiovascular: No pedal edema Respiratory: No cyanosis, no use of accessory musculature Skin: No lesions in the area of chief complaint Neurologic: Sensation intact distally Psychiatric: Patient is competent for consent with normal mood and affect Lymphatic: No axillary or cervical lymphadenopathy   Assessment/Plan:  LEFT-SIDED LUMBAR  RADICULOPATHY Plan for Procedure(s): EXTRAFORAMINAL LUMBAR 2- LUMBAR 3 DECOMPRESSION   Norva Karvonen, MD 03/25/2019 10:41 AM

## 2019-03-26 ENCOUNTER — Encounter (HOSPITAL_COMMUNITY): Payer: Self-pay | Admitting: Orthopedic Surgery

## 2019-03-26 MED FILL — Thrombin For Soln Kit 20000 Unit: CUTANEOUS | Qty: 1 | Status: AC

## 2019-04-24 ENCOUNTER — Encounter: Payer: Self-pay | Admitting: Physician Assistant

## 2019-04-27 ENCOUNTER — Other Ambulatory Visit: Payer: Self-pay

## 2019-04-27 DIAGNOSIS — M5442 Lumbago with sciatica, left side: Secondary | ICD-10-CM

## 2019-04-27 MED ORDER — GABAPENTIN 400 MG PO CAPS: 400.0000 mg | ORAL_CAPSULE | Freq: Two times a day (BID) | ORAL | 0 refills | Status: DC

## 2020-06-11 ENCOUNTER — Ambulatory Visit
Admission: EM | Admit: 2020-06-11 | Discharge: 2020-06-11 | Disposition: A | Discharge status: Home / Self Care | Payer: 59 | Attending: Emergency Medicine | Admitting: Emergency Medicine

## 2020-06-11 DIAGNOSIS — Z1152 Encounter for screening for COVID-19: Secondary | ICD-10-CM | POA: Diagnosis not present

## 2020-06-11 NOTE — ED Triage Notes (Signed)
Needs covid test

## 2020-06-14 LAB — COVID-19, FLU A+B NAA
Influenza A, NAA: NOT DETECTED
Influenza B, NAA: NOT DETECTED
SARS-CoV-2, NAA: DETECTED — AB

## 2020-10-07 IMAGING — DX LUMBAR SPINE - 2-3 VIEW
2 series · 2 of 2 positions shown · non-contrast
Comparison: None.

CLINICAL DATA: Low back pain and left leg weakness.

EXAM:
LUMBAR SPINE - 2-3 VIEW

[l-spine ap]
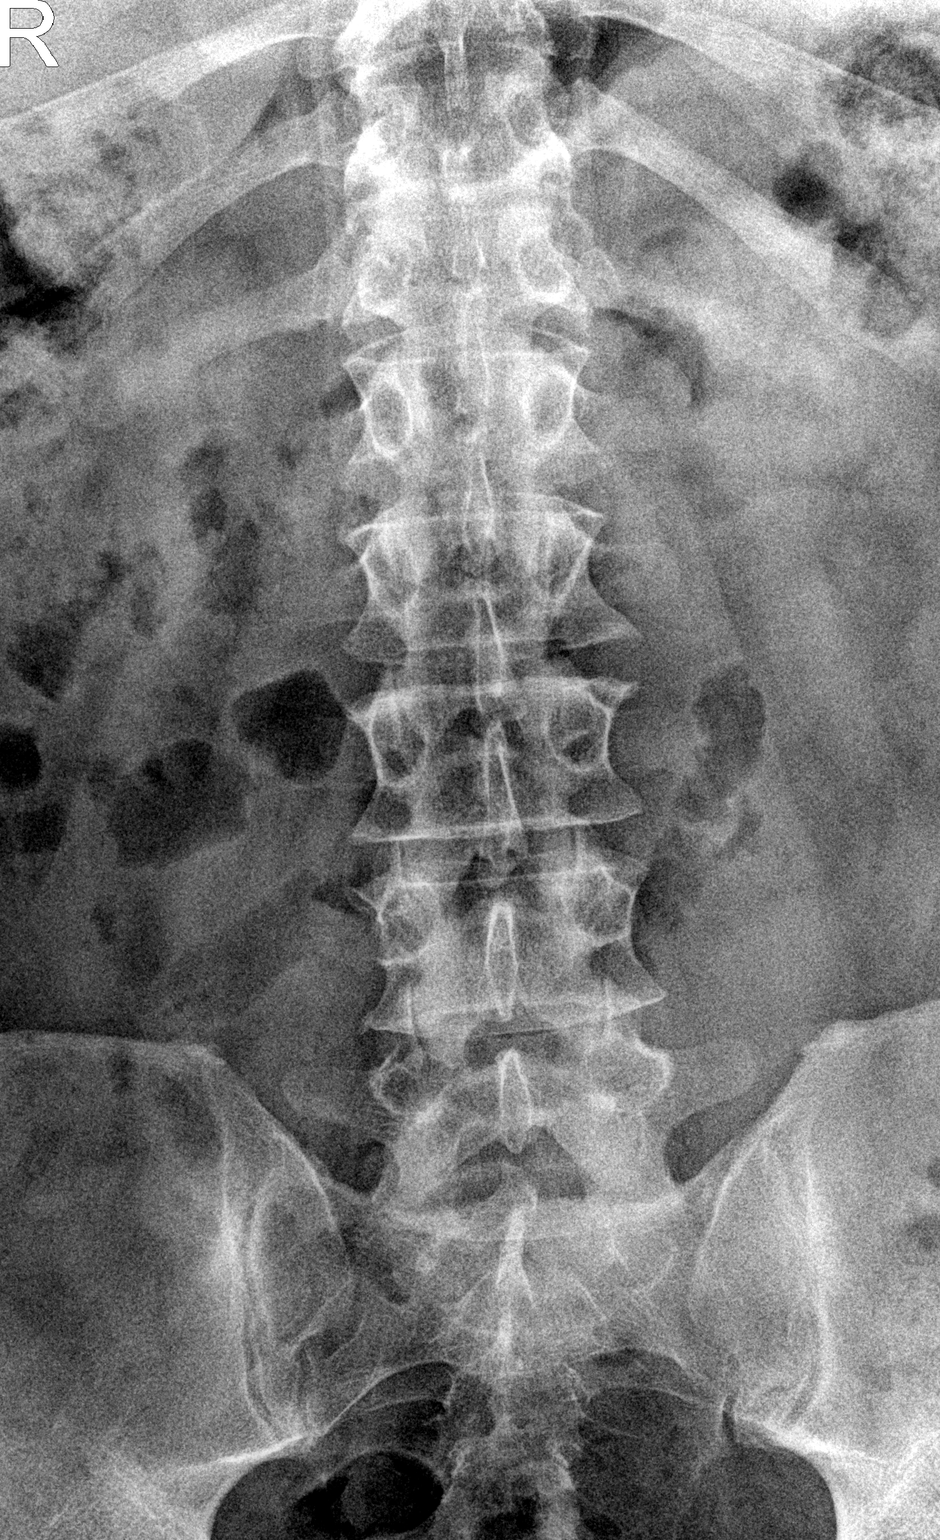

[l-spine lat]
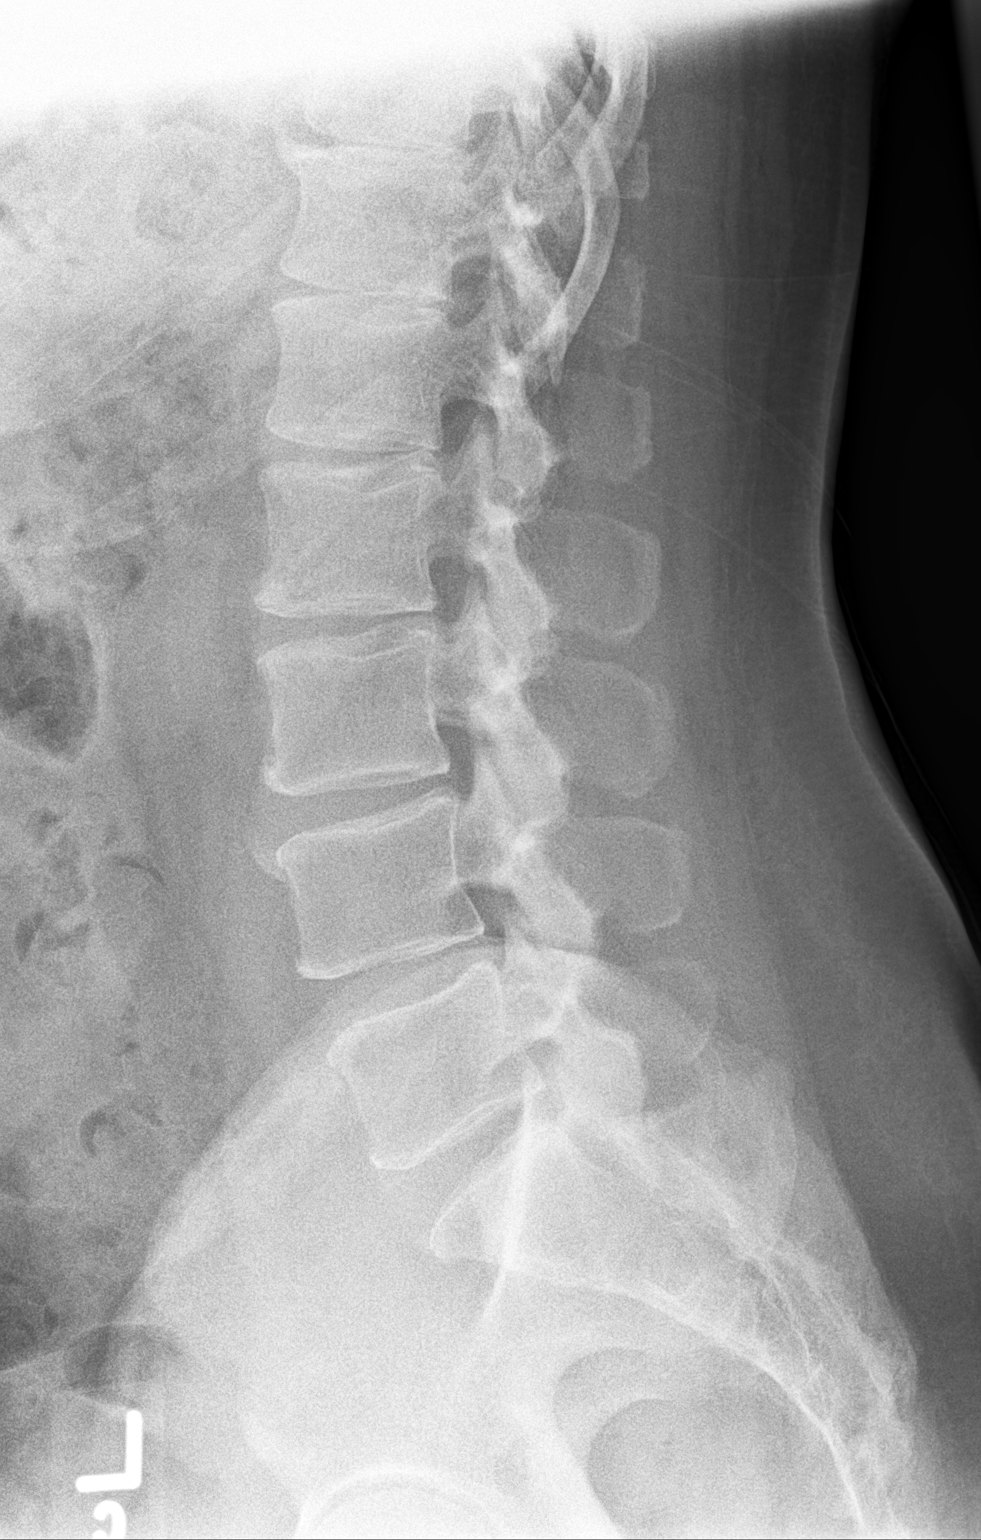

[2 of 2 positions shown; findings below may reference images not displayed]

FINDINGS: No fracture or spondylolisthesis is noted. Mild degenerative disc
disease is noted at L2-3 and L3-4 with minimal anterior osteophyte
formation.
IMPRESSION: Mild multilevel degenerative disc disease. No acute abnormality seen
in the lumbar spine.

## 2020-10-15 IMAGING — XA Imaging study
2 series · 2 of 2 positions shown · non-contrast
Comparison: none

CLINICAL DATA: Lumbosacral spondylosis without myelopathy with
radiculopathy. Left-sided back pain. Left groin and thigh pain,
numbness, and tingling. Mild left lateral recess stenosis at L4-L5.

[Series 1: ortho standard · 1 of 1 slices shown (1 of 2)]
[im 1/1]
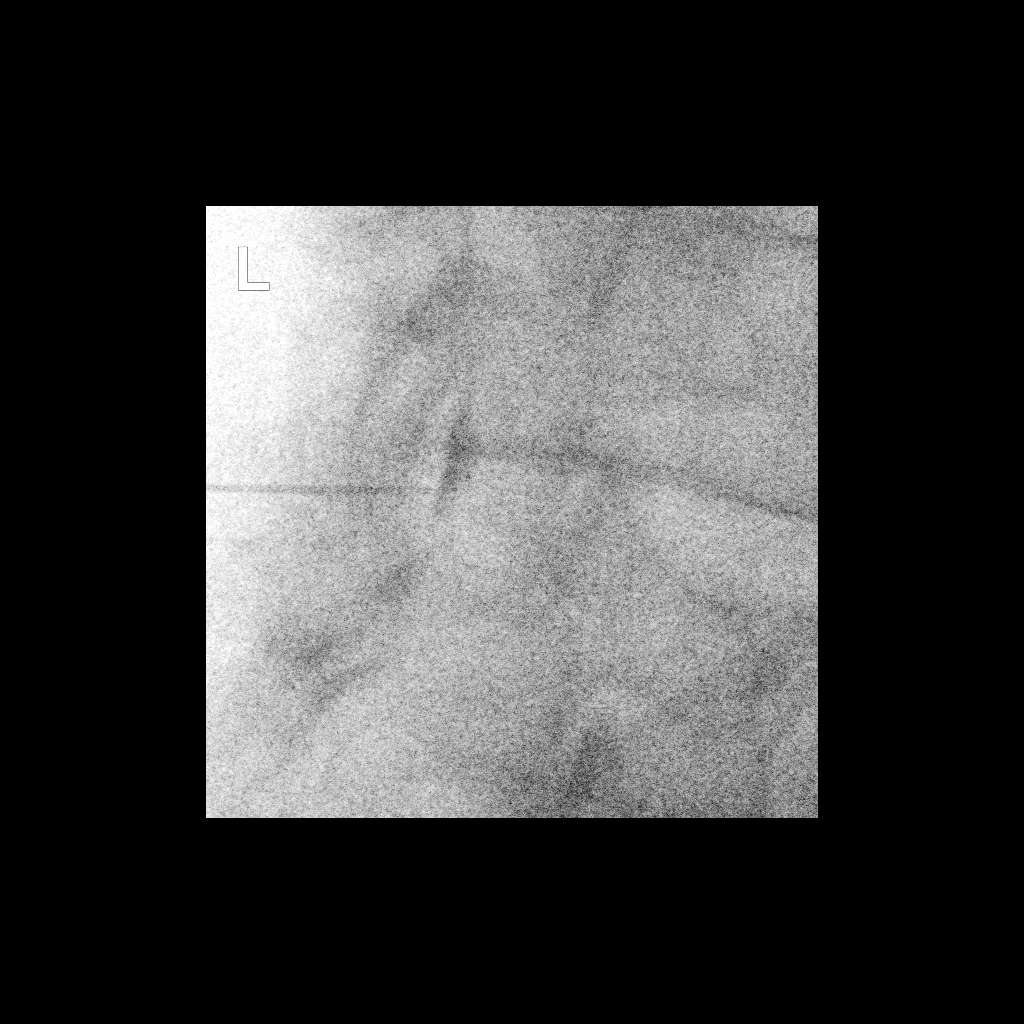

[Series 2: ortho standard · 1 of 1 slices shown (2 of 2)]
[im 1/1]
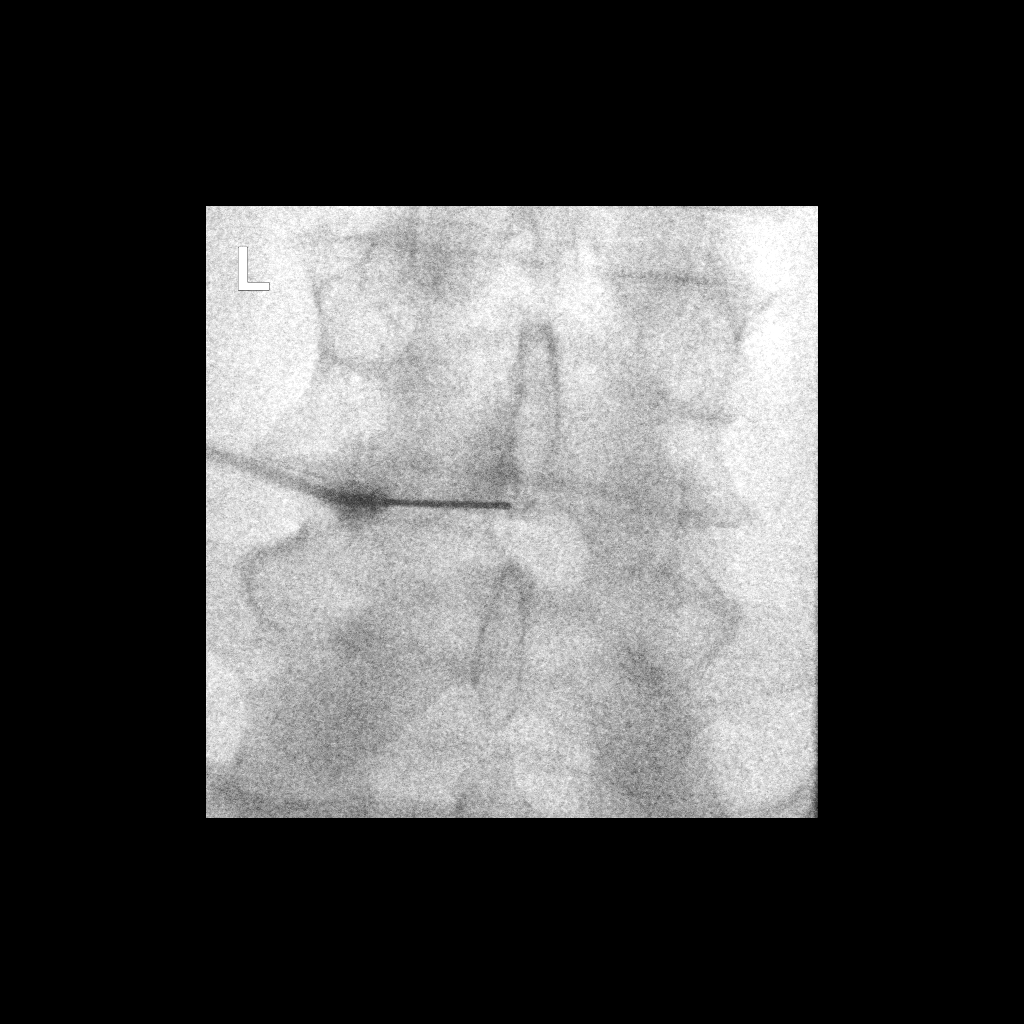

[2 of 2 positions shown; findings below may reference images not displayed]

FLUOROSCOPY TIME:  Radiation Exposure Index (as provided by the
fluoroscopic device): 2.1 mGy

Fluoroscopy Time:  2 seconds

Number of Acquired Images:  0

PROCEDURE:
The procedure, risks, benefits, and alternatives were explained to
the patient. Questions regarding the procedure were encouraged and
answered. The patient understands and consents to the procedure.

LUMBAR EPIDURAL INJECTION:

An interlaminar approach was performed on the left at L4-L5. The
overlying skin was cleansed and anesthetized. A 3.5 inch 20 gauge
epidural needle was advanced using loss-of-resistance technique.

DIAGNOSTIC EPIDURAL INJECTION:

Injection of Isovue-M 200 shows a good epidural pattern with spread
above and below the level of needle placement, primarily on the
left. No vascular opacification is seen.

THERAPEUTIC EPIDURAL INJECTION:

120 mg of Depo-Medrol mixed with 3 mL of 1% lidocaine were
instilled. The procedure was well-tolerated, and the patient was
discharged thirty minutes following the injection in good condition.

COMPLICATIONS:
None immediate.
IMPRESSION: Technically successful interlaminar epidural injection on the left
at L4-L5.

## 2021-08-07 ENCOUNTER — Encounter: Payer: Self-pay | Admitting: Gastroenterology

## 2022-11-12 ENCOUNTER — Other Ambulatory Visit: Payer: Self-pay | Admitting: Family Medicine

## 2022-11-12 ENCOUNTER — Ambulatory Visit: Payer: Self-pay

## 2022-11-12 DIAGNOSIS — Z0000 Encounter for general adult medical examination without abnormal findings: Secondary | ICD-10-CM

## 2023-07-02 NOTE — Progress Notes (Signed)
 [name] [address] [place] [address] Phone: [phone] Subjective:   LILLETTE Eduardo [name], am serving as a scribe for Dr. Arthea Claudene.  I'm seeing this patient by the request  of:  Medicine, [facility]  CC: Right hip pain  [name]  Eduardo [name] is a 53 y.o. male coming in with complaint of R hip pain. Patient states that pain is located in R piriformis. Pain will radiate down lateral aspect of R leg and it will cause numbness. Had similar issue on L side at one point. Unable to flex hip to walk up steps and leg will give out on him when trying to descend stairs. Took gabapentin  prior to back surgery and it worked well. Had bad brain fog when he came off of medication.   Partial discectomy October 2020 L2/L3    Patient has seen primary care provider and multiple different medications have been prescribed over the years.  Robaxin, Voltaren as well for the pain.  Patient has also been on gabapentin  previously.  Past medical history is significant for laminectomy at L2-L3  Patient's previous MRI in 2020 was independently visualized by me showing that there was a potential for stenosis with an L5 nerve root impingement at L4-L5.  Patient did have a new MRI done at [facility] that was independently visualized by me showing the patient did have an L4-L5 impingement noted on the right side.  Past Medical History:  Diagnosis Date   Arthritis    GERD (gastroesophageal reflux disease)    Hypothyroidism    Past Surgical History:  Procedure Laterality Date   FOOT SURGERY Right    had accessory bone removed.   LUMBAR LAMINECTOMY/DECOMPRESSION MICRODISCECTOMY N/A 03/25/2019   Procedure: LUMBAR 2- LUMBAR 3 DECOMPRESSION;  Surgeon: Beuford Anes, MD;  Location: [facility] OR;  Service: Orthopedics;  Laterality: N/A;   Social History   Socioeconomic History   Marital status: Married    Spouse name: Not on file   Number of children: 0    Years of education: Not on file   Highest education level: Not on file  Occupational History   Not on file  Tobacco Use   Smoking status: Former    Current packs/day: 0.00    Types: Cigarettes    Quit date: 2007    Years since quitting: 18.1   Smokeless tobacco: Never  Vaping Use   Vaping status: Never Used  Substance and Sexual Activity   Alcohol use: Yes    Comment: occasionally   Drug use: No   Sexual activity: Yes  Other Topics Concern   Not on file  Social History Narrative   Not on file   Social Drivers of Health   Financial Resource Strain: Low Risk  (08/17/2022)   Received from [facility], [facility]   Overall Financial Resource Strain (CARDIA)    Difficulty of Paying Living Expenses: Not very hard  Food Insecurity: No Food Insecurity (08/17/2022)   Received from [facility], [facility]   Hunger Vital Sign    Worried About Running Out of Food in the Last Year: Never true    Ran Out of Food in the Last Year: Never true  Transportation Needs: No Transportation Needs (08/17/2022)   Received from [facility], [facility]   PRAPARE - Transportation    Lack of Transportation (Medical): No    Lack of Transportation (Non-Medical): No  Physical Activity: Insufficiently Active (08/17/2022)   [facility]   Exercise Vital Sign    Days of Exercise per Week: 1 day    Minutes of Exercise per Session: 30 min  Stress: No Stress Concern Present (08/17/2022)   Received from [facility], [facility] - Occupational Stress Questionnaire    Feeling of Stress : Only a little  Social Connections: Moderately Integrated (08/17/2022)   Received from [facility], [facility]   Social Network    How would you rate your social network (family, work, friends)?: Adequate participation with social networks   No Known Allergies Family History  Adopted: Yes    Current Outpatient Medications  (Endocrine & Metabolic):    Semaglutide, 1 MG/DOSE, (OZEMPIC, 1 MG/DOSE,) 2 MG/1.5ML SOPN, Inject into the skin.  Current Outpatient Medications (Cardiovascular):    pravastatin  (PRAVACHOL ) 40 MG tablet, Take 1 tablet (40 mg total) by mouth daily.   Current Outpatient Medications (Analgesics):    diclofenac (VOLTAREN) 75 MG EC tablet, Take 75 mg by mouth 2 (two) times daily.   Current Outpatient Medications (Other):    clonazePAM (KLONOPIN) 1 MG disintegrating tablet, Take 1 mg by mouth 2 (two) times daily.   methocarbamol (ROBAXIN) 750 MG tablet, Take 750 mg by mouth 4 (four) times daily.   Reviewed prior external information including notes and imaging from  primary care provider As well as notes that were available from care everywhere and other healthcare systems.  Past medical history, social, surgical and family history all reviewed in electronic medical record.  No pertanent information unless stated regarding to the chief complaint.   Review of Systems:  No headache, visual changes, nausea, vomiting, diarrhea, constipation, dizziness, abdominal pain, skin rash, fevers, chills, night sweats, weight loss, swollen lymph nodes,  joint swelling, chest pain, shortness of breath, mood changes. POSITIVE muscle aches, body aches  Objective  Blood pressure 112/78, pulse 85, height 5' 10 (1.778 m), weight 278 lb (126.1 kg), SpO2 97%.   General: No apparent distress alert and oriented x3 mood and affect normal, dressed appropriately.  HEENT: Pupils equal, extraocular movements intact  Respiratory: Patient's speak in full sentences and does not appear short of breath  Cardiovascular: No lower extremity edema, non tender, no erythema  Back exam does have significant loss of lordosis.  Positive straight leg test noted on the right side.  Worsening pain with flexion with radicular symptoms in the L4 and even the L5 area.  No weakness noted.    Impression and Recommendations:     The  above documentation has been reviewed and is accurate and complete Jayle Solarz M Tahara Ruffini, DO

## 2023-07-04 ENCOUNTER — Ambulatory Visit: Payer: 59 | Admitting: Family Medicine

## 2023-07-04 ENCOUNTER — Encounter: Payer: Self-pay | Admitting: Family Medicine

## 2023-07-04 VITALS — BP 112/78 | HR 85 | Ht 70.0 in | Wt 278.0 lb

## 2023-07-04 DIAGNOSIS — M2550 Pain in unspecified joint: Secondary | ICD-10-CM

## 2023-07-04 DIAGNOSIS — M545 Low back pain, unspecified: Secondary | ICD-10-CM

## 2023-07-04 DIAGNOSIS — M5416 Radiculopathy, lumbar region: Secondary | ICD-10-CM | POA: Diagnosis not present

## 2023-07-04 MED ORDER — METHYLPREDNISOLONE ACETATE 80 MG/ML IJ SUSP
80.0000 mg | Freq: Once | INTRAMUSCULAR | Status: AC
  Administered 2023-07-04: 80 mg via INTRAMUSCULAR

## 2023-07-04 MED ORDER — KETOROLAC TROMETHAMINE 60 MG/2ML IM SOLN
60.0000 mg | Freq: Once | INTRAMUSCULAR | Status: AC
  Administered 2023-07-04: 60 mg via INTRAMUSCULAR

## 2023-07-04 NOTE — Patient Instructions (Addendum)
 [facility] [phone]  Cocktail injection today Continue medicine's you're currently on See you again in 4-6 weeks

## 2023-07-04 NOTE — Assessment & Plan Note (Signed)
 Patient's pain is out of proportion.  Will get x-ray to further evaluate the hip but it does seem to be more of a lumbar radiculopathy.  We discussed with patient about icing regimen, discussed which activities to do and which ones to avoid.  We discussed increasing activity only as tolerated.  Do feel that a possible epidural would be beneficial at the L4-L5 area.  Discussed different medications which patient has done gabapentin  previously.  Toradol  and Depo-Medrol  given today to further evaluate.  Patient is having difficulty with his job, affecting daily activities and is sleeping in a chair because he is unable to lay down.  Patient will follow-up after the injection to see how patient responds.

## 2023-07-06 ENCOUNTER — Encounter: Payer: Self-pay | Admitting: Family Medicine

## 2023-07-09 NOTE — Discharge Instructions (Signed)

## 2023-07-10 ENCOUNTER — Ambulatory Visit
Admission: RE | Admit: 2023-07-10 | Discharge: 2023-07-10 | Disposition: A | Discharge status: Home / Self Care | Payer: 59 | Source: Ambulatory Visit | Attending: Family Medicine | Admitting: Family Medicine

## 2023-07-10 ENCOUNTER — Other Ambulatory Visit: Payer: Self-pay | Admitting: Family Medicine

## 2023-07-10 DIAGNOSIS — M545 Low back pain, unspecified: Secondary | ICD-10-CM

## 2023-07-10 DIAGNOSIS — M5416 Radiculopathy, lumbar region: Secondary | ICD-10-CM

## 2023-07-10 DIAGNOSIS — M2550 Pain in unspecified joint: Secondary | ICD-10-CM

## 2023-07-10 MED ORDER — METHYLPREDNISOLONE ACETATE 40 MG/ML INJ SUSP (RADIOLOG
80.0000 mg | Freq: Once | INTRAMUSCULAR | Status: AC
  Administered 2023-07-10: 80 mg via EPIDURAL

## 2023-07-10 MED ORDER — IOPAMIDOL (ISOVUE-M 200) INJECTION 41%
1.0000 mL | Freq: Once | INTRAMUSCULAR | Status: AC
  Administered 2023-07-10: 1 mL via EPIDURAL

## 2023-07-30 NOTE — Progress Notes (Unsigned)
 [name] [address] [place] [address] Phone: [phone] Subjective:   Eduardo [name] am a scribe for Dr. Katrinka Blazing.   I'm seeing this patient by the request  of:  Medicine, [facility]  CC: Low back pain follow-up  [id number]  07/04/2023 Patient's pain is out of proportion.  Will get x-ray to further evaluate the hip but it does seem to be more of a lumbar radiculopathy.  We discussed with patient about icing regimen, discussed which activities to do and which ones to avoid.  We discussed increasing activity only as tolerated.  Do feel that a possible epidural would be beneficial at the L4-L5 area.  Discussed different medications which patient has done gabapentin previously.  Toradol and Depo-Medrol given today to further evaluate.  Patient is having difficulty with his job, affecting daily activities and is sleeping in a chair because he is unable to lay down.  Patient will follow-up after the injection to see how patient responds.     Updated 08/01/2023 Eduardo [name] is a 53 y.o. male coming in with complaint of back pain, was found to have more radicular symptoms.  Last exam given Toradol and Depo-Medrol injections.  Patient also had undergone a epidural at the L4-L5 area on February 12.  Patient states that his leg hurts. Thinks that the injection made it worse than without the injection.        Past Medical History:  Diagnosis Date   Arthritis    GERD (gastroesophageal reflux disease)    Hypothyroidism    Past Surgical History:  Procedure Laterality Date   FOOT SURGERY Right    had accessory bone removed.   LUMBAR LAMINECTOMY/DECOMPRESSION MICRODISCECTOMY N/A 03/25/2019   Procedure: LUMBAR 2- LUMBAR 3 DECOMPRESSION;  Surgeon: Estill Bamberg, MD;  Location: [facility] OR;  Service: Orthopedics;  Laterality: N/A;   Social History   Socioeconomic History   Marital status: Married    Spouse name: Not on file   Number  of children: 0   Years of education: Not on file   Highest education level: Not on file  Occupational History   Not on file  Tobacco Use   Smoking status: Former    Current packs/day: 0.00    Types: Cigarettes    Quit date: 2007    Years since quitting: 18.1   Smokeless tobacco: Never  Vaping Use   Vaping status: Never Used  Substance and Sexual Activity   Alcohol use: Yes    Comment: occasionally   Drug use: No   Sexual activity: Yes  Other Topics Concern   Not on file  Social History Narrative   Not on file   Social Drivers of Health   Financial Resource Strain: Low Risk  (07/18/2023)   Received from [facility]   Overall Financial Resource Strain (CARDIA)    Difficulty of Paying Living Expenses: Not hard at all  Food Insecurity: No Food Insecurity (07/18/2023)   Received from [facility]   Hunger Vital Sign    Worried About Running Out of Food in the Last Year: Never true    Ran Out of Food in the Last Year: Never true  Transportation Needs: No Transportation Needs (07/18/2023)   Received from [facility] - Transportation    Lack of Transportation (Medical): No    Lack of Transportation (Non-Medical): No  Physical Activity: Insufficiently Active (07/18/2023)   Received from [facility]   Exercise Vital Sign  Days of Exercise per Week: 5 days    Minutes of Exercise per Session: 10 min  Stress: Stress Concern Present (07/18/2023)   Received from [facility] - Occupational Stress Questionnaire    Feeling of Stress : To some extent  Social Connections: Moderately Integrated (07/18/2023)   Received from [facility]   Social Network    How would you rate your social network (family, work, friends)?: Adequate participation with social networks   No Known Allergies Family History  Adopted: Yes    Current Outpatient Medications (Endocrine & Metabolic):    Semaglutide, 1 MG/DOSE, (OZEMPIC, 1 MG/DOSE,) 2  MG/1.5ML SOPN, Inject into the skin.  Current Outpatient Medications (Cardiovascular):    pravastatin (PRAVACHOL) 40 MG tablet, Take 1 tablet (40 mg total) by mouth daily.   Current Outpatient Medications (Analgesics):    diclofenac (VOLTAREN) 75 MG EC tablet, Take 75 mg by mouth 2 (two) times daily.   Current Outpatient Medications (Other):    DULoxetine (CYMBALTA) 20 MG capsule, Take 1 capsule (20 mg total) by mouth daily.   clonazePAM (KLONOPIN) 1 MG disintegrating tablet, Take 1 mg by mouth 2 (two) times daily.   methocarbamol (ROBAXIN) 750 MG tablet, Take 750 mg by mouth 4 (four) times daily.   Reviewed prior external information including notes and imaging from  primary care provider As well as notes that were available from care everywhere and other healthcare systems.  Past medical history, social, surgical and family history all reviewed in electronic medical record.  No pertanent information unless stated regarding to the chief complaint.   Review of Systems:  No headache, visual changes, nausea, vomiting, diarrhea, constipation, dizziness, abdominal pain, skin rash, fevers, chills, night sweats, weight loss, swollen lymph nodes, body aches, joint swelling, chest pain, shortness of breath, mood changes. POSITIVE muscle aches  Objective  Blood pressure 132/80, pulse 83, height 5\' 10"  (1.778 m), weight 282 lb (127.9 kg), SpO2 97%.   General: No apparent distress alert and oriented x3 mood and affect normal, dressed appropriately.  HEENT: Pupils equal, extraocular movements intact  Respiratory: Patient's speak in full sentences and does not appear short of breath  Cardiovascular: No lower extremity edema, non tender, no erythema  Low back exam shows loss lordosis.  Significant tenderness noted.  Patient does have tightness noted of the hamstrings bilaterally.    Impression and Recommendations:    The above documentation has been reviewed and is accurate and complete  Judi Saa, DO

## 2023-08-01 ENCOUNTER — Ambulatory Visit: Payer: 59 | Admitting: Family Medicine

## 2023-08-01 ENCOUNTER — Encounter: Payer: Self-pay | Admitting: Family Medicine

## 2023-08-01 VITALS — BP 132/80 | HR 83 | Ht 70.0 in | Wt 282.0 lb

## 2023-08-01 DIAGNOSIS — M5416 Radiculopathy, lumbar region: Secondary | ICD-10-CM | POA: Diagnosis not present

## 2023-08-01 MED ORDER — DULOXETINE HCL 20 MG PO CPEP: 20.0000 mg | ORAL_CAPSULE | Freq: Every day | ORAL | 0 refills | Status: DC

## 2023-08-01 NOTE — Patient Instructions (Signed)
 Cymbalta 20 mg Contact us in a week.  Follow up in 2 months.

## 2023-08-01 NOTE — Assessment & Plan Note (Signed)
 Continues to have radicular symptoms.  Did not respond extremely well to the epidural.  Patient continues to have discomfort and we did discuss different treatment options.  Elected to try the Cymbalta at 20 mg.  Can increase all the way to 90 mg if needed.  Did discuss with patient that should feel some improvement within 5 to 7 days though.  Patient is in agreement to try this.  With patient continuing to have chronic pain on a daily basis that is stopping him from even work or even trying to work out and failing all other conservative therapy we discussed we either need to consider the possibility of surgical intervention or to discuss right nerve conduction study to further evaluate.  Patient has elected to try the nerve conduction study.

## 2023-08-12 ENCOUNTER — Other Ambulatory Visit: Payer: Self-pay

## 2023-08-12 ENCOUNTER — Encounter: Payer: Self-pay | Admitting: Family Medicine

## 2023-08-12 ENCOUNTER — Encounter: Payer: Self-pay | Admitting: Neurology

## 2023-08-12 DIAGNOSIS — R202 Paresthesia of skin: Secondary | ICD-10-CM

## 2023-08-12 DIAGNOSIS — M79604 Pain in right leg: Secondary | ICD-10-CM

## 2023-08-12 NOTE — Telephone Encounter (Signed)
 Order placed

## 2023-08-23 ENCOUNTER — Encounter: Admitting: Neurology

## 2023-09-01 ENCOUNTER — Other Ambulatory Visit: Payer: Self-pay | Admitting: Family Medicine

## 2023-09-06 ENCOUNTER — Ambulatory Visit: Admitting: Neurology

## 2023-09-06 DIAGNOSIS — R202 Paresthesia of skin: Secondary | ICD-10-CM | POA: Diagnosis not present

## 2023-09-06 NOTE — Procedures (Signed)
 [facility]  [address] [place], [address]  [place], [place] [address] Tel: [phone] Fax: [phone] Test Date:  09/06/2023  Patient: Eduardo [name] DOB: [birth year] Physician: Nita Sickle, DO  Sex: Male Height: 5\' 10"  Ref Phys: Antoine Primas, DO  ID#: [id number]   Technician:    History: This is a 53 year old man referred for evaluation of right leg pain.  NCV & EMG Findings: Electrodiagnostic testing of the right lower extremity and additional studies of the left shows: Bilateral sural and superficial peroneal sensory responses are within normal limits. Bilateral peroneal and tibial motor responses are within normal limits. Bilateral tibial H reflex studies are within normal limits. There is no evidence of active or chronic motor axonal changes affecting any of the tested muscles.  Motor unit configuration and recruitment pattern is within normal limits.   Impression: This is a normal study of the lower extremities.  In particular, there is no evidence of a large fiber sensorimotor polyneuropathy or lumbosacral radiculopathy.    ___________________________ Nita Sickle, DO    Nerve Conduction Studies   Stim Site NR Peak (ms) Norm Peak (ms) O-P Amp (V) Norm O-P Amp  Left Sup Peroneal Anti Sensory (Ant Lat Mall)  32 C  12 cm    3.2 <4.6 7.1 >4  Right Sup Peroneal Anti Sensory (Ant Lat Mall)  32 C  12 cm    3.6 <4.6 6.3 >4  Left Sural Anti Sensory (Lat Mall)  32 C  Calf    2.8 <4.6 21.5 >4  Right Sural Anti Sensory (Lat Mall)  32 C  Calf    2.7 <4.6 19.6 >4     Stim Site NR Onset (ms) Norm Onset (ms) O-P Amp (mV) Norm O-P Amp Site1 Site2 Delta-0 (ms) Dist (cm) Vel (m/s) Norm Vel (m/s)  Left Peroneal Motor (Ext Dig Brev)  32 C  Ankle    4.1 <6.0 6.3 >2.5 B Fib Ankle 8.0 36.0 45 >40  B Fib    12.1  5.9  Poplt B Fib 1.7 8.0 47 >40  Poplt    13.8  5.5         Right Peroneal Motor (Ext Dig Brev)  32 C  Ankle    3.7 <6.0 5.2 >2.5 B Fib Ankle 8.4 35.0 42  >40  B Fib    12.1  4.5  Poplt B Fib 1.7 8.0 47 >40  Poplt    13.8  4.4         Left Tibial Motor (Abd Hall Brev)  32 C  Ankle    3.4 <6.0 16.3 >4 Knee Ankle 9.6 43.0 45 >40  Knee    13.0  10.6         Right Tibial Motor (Abd Hall Brev)  32 C  Ankle    3.6 <6.0 13.9 >4 Knee Ankle 9.2 45.0 49 >40  Knee    12.8  10.0          Electromyography   Side Muscle Ins.Act Fibs Fasc Recrt Amp Dur Poly Activation Comment  Right AntTibialis Nml Nml Nml Nml Nml Nml Nml Nml N/A  Right Gastroc Nml Nml Nml Nml Nml Nml Nml Nml N/A  Right Flex Dig Long Nml Nml Nml Nml Nml Nml Nml Nml N/A  Right RectFemoris Nml Nml Nml Nml Nml Nml Nml Nml N/A  Right GluteusMed Nml Nml Nml Nml Nml Nml Nml Nml N/A  Left AntTibialis Nml Nml Nml Nml Nml Nml Nml Nml N/A  Left  Gastroc Nml Nml Nml Nml Nml Nml Nml Nml N/A  Left Flex Dig Long Nml Nml Nml Nml Nml Nml Nml Nml N/A  Left RectFemoris Nml Nml Nml Nml Nml Nml Nml Nml N/A  Left GluteusMed Nml Nml Nml Nml Nml Nml Nml Nml N/A      Waveforms:

## 2023-09-10 ENCOUNTER — Encounter: Payer: Self-pay | Admitting: Family Medicine

## 2023-09-26 ENCOUNTER — Encounter: Admitting: Neurology

## 2023-09-26 ENCOUNTER — Ambulatory Visit: Admitting: Neurology

## 2023-10-01 ENCOUNTER — Ambulatory Visit: Admitting: Family Medicine

## 2023-10-01 VITALS — BP 122/82 | HR 88 | Ht 70.0 in | Wt 274.0 lb

## 2023-10-01 DIAGNOSIS — E039 Hypothyroidism, unspecified: Secondary | ICD-10-CM | POA: Diagnosis not present

## 2023-10-01 DIAGNOSIS — M2550 Pain in unspecified joint: Secondary | ICD-10-CM | POA: Diagnosis not present

## 2023-10-01 DIAGNOSIS — E559 Vitamin D deficiency, unspecified: Secondary | ICD-10-CM

## 2023-10-01 DIAGNOSIS — M5416 Radiculopathy, lumbar region: Secondary | ICD-10-CM

## 2023-10-01 MED ORDER — DULOXETINE HCL 20 MG PO CPEP: 40.0000 mg | ORAL_CAPSULE | Freq: Every day | ORAL | 1 refills | Status: DC

## 2023-10-01 NOTE — Patient Instructions (Addendum)
 Labs today Increase Cymbalta  40mg  See you again in 6 weeks

## 2023-10-01 NOTE — Assessment & Plan Note (Signed)
 Concerned treatment has not been extremely well at the moment.  Has had it initially and was on the hypothyroid medication but then has discontinued because his lab values were normal.  Patient does have a history of depression and significant stress.  Do feel that this could be contributing.  Will recheck labs to further evaluate.  May need to restart treatment.  Can be contributing to some of the discomfort as well.

## 2023-10-01 NOTE — Assessment & Plan Note (Signed)
 Attempted injection of the back did not make any significant benefit.  May need to further image the hip.  Low back exam does have some mild arthritis we can see of the hip but very minimal.

## 2023-10-01 NOTE — Progress Notes (Unsigned)
 [facility] [address] [place] [address] Phone: [phone] Subjective:   [name], am serving as a scribe for Dr. Ronnell Coins.  I'm seeing this patient by the request  of:  Medicine, [facility]  CC: Multiple joint complaint Beese mostly right back and leg pain  [id number]  08/01/2023 Continues to have radicular symptoms.  Did [name] respond extremely well to the epidural.  Patient continues to have discomfort and we did discuss different treatment options.  Elected to try the Cymbalta  at 20 mg.  Can increase all the way to 90 mg if needed.  Did discuss with patient that should feel some improvement within 5 to 7 days though.  Patient is in agreement to try this.  With patient continuing to have chronic pain on a daily basis that is stopping him from even work or even trying to work out and failing all other conservative therapy we discussed we either need to consider the possibility of surgical intervention or to discuss right nerve conduction study to further evaluate.  Patient has elected to try the nerve conduction study.      Updated 10/01/2023 Eduardo [name] is a 53 y.o. male coming in with complaint of back pain. Pain today is a little less. Has happened before when pain lessens, but then comes back. When having flares last several months. Rest helps. Cymbalta  maybe has helped a little.  Nerve conduction study was normal. Epidural given February 12.  Started on Cymbalta  previously.  Past Medical History:  Diagnosis Date   Arthritis    GERD (gastroesophageal reflux disease)    Hypothyroidism    Past Surgical History:  Procedure Laterality Date   FOOT SURGERY Right    had accessory bone removed.   LUMBAR LAMINECTOMY/DECOMPRESSION MICRODISCECTOMY N/A 03/25/2019   Procedure: LUMBAR 2- LUMBAR 3 DECOMPRESSION;  Surgeon: Virl Grimes, MD;  Location: [facility] OR;  Service: Orthopedics;  Laterality: N/A;   Social History    Socioeconomic History   Marital status: Married    Spouse name: [name] on file   Number of children: 0   Years of education: [name] on file   Highest education level: [name] on file  Occupational History   [name] on file  Tobacco Use   Smoking status: Former    Current packs/day: 0.00    Types: Cigarettes    Quit date: 2007    Years since quitting: 18.3   Smokeless tobacco: Never  Vaping Use   Vaping status: Never Used  Substance and Sexual Activity   Alcohol use: Yes    Comment: occasionally   Drug use: No   Sexual activity: Yes  Other Topics Concern   [name] on file  Social History Narrative   [name] on file   Social Drivers of Health   Financial Resource Strain: Low Risk  (07/18/2023)   Received from [facility]   Overall Financial Resource Strain (CARDIA)    Difficulty of Paying Living Expenses: [name] hard at all  Food Insecurity: No Food Insecurity (07/18/2023)   Received from [facility]   Hunger Vital Sign    Worried About Running Out of Food in the Last Year: Never true    Ran Out of Food in the Last Year: Never true  Transportation Needs: No Transportation Needs (07/18/2023)   Received from [facility] - Transportation    Lack of Transportation (Medical): No    Lack of Transportation (Non-Medical): No  Physical Activity:  Insufficiently Active (07/18/2023)   [facility]   Exercise Vital Sign    Days of Exercise per Week: 5 days    Minutes of Exercise per Session: 10 min  Stress: Stress Concern Present (07/18/2023)   Received from [facility] of Occupational Health - Occupational Stress Questionnaire    Feeling of Stress : To some extent  Social Connections: Moderately Integrated (07/18/2023)   Received from [facility]   Social Network    How would you rate your social network (family, work, friends)?: Adequate participation with social networks   No Known Allergies Family History  Adopted: Yes    Current  Outpatient Medications (Endocrine & Metabolic):    Semaglutide, 1 MG/DOSE, (OZEMPIC, 1 MG/DOSE,) 2 MG/1.5ML SOPN, Inject into the skin.  Current Outpatient Medications (Cardiovascular):    pravastatin  (PRAVACHOL ) 40 MG tablet, Take 1 tablet (40 mg total) by mouth daily.   Current Outpatient Medications (Analgesics):    diclofenac (VOLTAREN) 75 MG EC tablet, Take 75 mg by mouth 2 (two) times daily.   Current Outpatient Medications (Other):    clonazePAM (KLONOPIN) 1 MG disintegrating tablet, Take 1 mg by mouth 2 (two) times daily.   DULoxetine  (CYMBALTA ) 20 MG capsule, Take 2 capsules (40 mg total) by mouth daily.   methocarbamol (ROBAXIN) 750 MG tablet, Take 750 mg by mouth 4 (four) times daily.   Reviewed prior external information including notes and imaging from  primary care provider As well as notes that were available from care everywhere and other healthcare systems.  Past medical history, social, surgical and family history all reviewed in electronic medical record.  No pertanent information unless stated regarding to the chief complaint.   Review of Systems:  No headache, visual changes, nausea, vomiting, diarrhea, constipation, dizziness, abdominal pain, skin rash, fevers, chills, night sweats, weight loss, swollen lymph nodes, joint swelling, chest pain, shortness of breath, mood changes. POSITIVE muscle aches, body aches  Objective  Blood pressure 122/82, pulse 88, height 5\' 10"  (1.778 m), weight 274 lb (124.3 kg), SpO2 96%.   General: No apparent distress alert and oriented x3 mood and affect normal, dressed appropriately.  HEENT: Pupils equal, extraocular movements intact  Respiratory: Patient's speak in full sentences and does [name] appear short of breath  Cardiovascular: No lower extremity edema, non tender, no erythema  Back exam shows still some loss of lordosis.  Poor core strength noted.  Patient is down some weight though since last visit. Neurovascularly intact  distally.  Some mild difficulty getting up from a seated position.    Impression and Recommendations:    The above documentation has been reviewed and is accurate and complete Eduardo Wynes M Skyllar Notarianni, DO

## 2023-10-02 ENCOUNTER — Encounter: Payer: Self-pay | Admitting: Family Medicine

## 2023-10-02 ENCOUNTER — Other Ambulatory Visit: Payer: Self-pay | Admitting: Family Medicine

## 2023-10-02 ENCOUNTER — Other Ambulatory Visit: Payer: Self-pay

## 2023-10-02 LAB — COMPREHENSIVE METABOLIC PANEL WITH GFR
ALT: 34 U/L (ref 0–53)
AST: 20 U/L (ref 0–37)
Albumin: 4.3 g/dL (ref 3.5–5.2)
Alkaline Phosphatase: 74 U/L (ref 39–117)
BUN: 10 mg/dL (ref 6–23)
CO2: 26 meq/L (ref 19–32)
Calcium: 9.2 mg/dL (ref 8.4–10.5)
Chloride: 105 meq/L (ref 96–112)
Creatinine, Ser: 0.82 mg/dL (ref 0.40–1.50)
GFR: 101.2 mL/min (ref >60.00)
Glucose, Bld: 93 mg/dL (ref 70–99)
Potassium: 3.8 meq/L (ref 3.5–5.1)
Sodium: 138 meq/L (ref 135–145)
Total Bilirubin: 0.4 mg/dL (ref 0.2–1.2)
Total Protein: 6.9 g/dL (ref 6.0–8.3)

## 2023-10-02 LAB — CBC WITH DIFFERENTIAL/PLATELET
Basophils Absolute: 0.1 10*3/uL (ref 0.0–0.1)
Basophils Relative: 0.7 % (ref 0.0–3.0)
Eosinophils Absolute: 0.1 10*3/uL (ref 0.0–0.7)
Eosinophils Relative: 1.3 % (ref 0.0–5.0)
HCT: 44.4 % (ref 39.0–52.0)
Hemoglobin: 14.7 g/dL (ref 13.0–17.0)
Lymphocytes Relative: 24.2 % (ref 12.0–46.0)
Lymphs Abs: 2.1 10*3/uL (ref 0.7–4.0)
MCHC: 33.2 g/dL (ref 30.0–36.0)
MCV: 91 fl (ref 78.0–100.0)
Monocytes Absolute: 0.9 10*3/uL (ref 0.1–1.0)
Monocytes Relative: 9.9 % (ref 3.0–12.0)
Neutro Abs: 5.6 10*3/uL (ref 1.4–7.7)
Neutrophils Relative %: 63.9 % (ref 43.0–77.0)
Platelets: 235 10*3/uL (ref 150.0–400.0)
RBC: 4.88 Mil/uL (ref 4.22–5.81)
RDW: 14 % (ref 11.5–15.5)
WBC: 8.8 10*3/uL (ref 4.0–10.5)

## 2023-10-02 LAB — IBC PANEL
Iron: 47 ug/dL (ref 42–165)
Saturation Ratios: 14.4 % — ABNORMAL LOW (ref 20.0–50.0)
TIBC: 326.2 ug/dL (ref 250.0–450.0)
Transferrin: 233 mg/dL (ref 212.0–360.0)

## 2023-10-02 LAB — VITAMIN D 25 HYDROXY (VIT D DEFICIENCY, FRACTURES): VITD: 25.01 ng/mL — ABNORMAL LOW (ref 30.00–100.00)

## 2023-10-02 LAB — C-REACTIVE PROTEIN: CRP: <1 mg/dL (ref 0.5–20.0)

## 2023-10-02 LAB — FERRITIN: Ferritin: 109.1 ng/mL (ref 22.0–322.0)

## 2023-10-02 LAB — T3, FREE: T3, Free: 3.7 pg/mL (ref 2.3–4.2)

## 2023-10-02 LAB — TSH: TSH: 4.47 u[IU]/mL (ref 0.35–5.50)

## 2023-10-02 LAB — SEDIMENTATION RATE: Sed Rate: 21 mm/h — ABNORMAL HIGH (ref 0–20)

## 2023-10-02 LAB — T4, FREE: Free T4: 0.76 ng/dL (ref 0.60–1.60)

## 2023-10-02 LAB — VITAMIN B12: Vitamin B-12: 479 pg/mL (ref 211–911)

## 2023-10-02 LAB — URIC ACID: Uric Acid, Serum: 7.9 mg/dL — ABNORMAL HIGH (ref 4.0–7.8)

## 2023-10-02 MED ORDER — LEVOTHYROXINE SODIUM 50 MCG PO TABS: 50.0000 ug | ORAL_TABLET | Freq: Every day | ORAL | 0 refills | Status: DC

## 2023-10-02 MED ORDER — VITAMIN D (ERGOCALCIFEROL) 1.25 MG (50000 UNIT) PO CAPS: 50000.0000 [IU] | ORAL_CAPSULE | ORAL | 0 refills | Status: DC

## 2023-10-02 MED ORDER — ALLOPURINOL 200 MG PO TABS: 200.0000 mg | ORAL_TABLET | Freq: Every day | ORAL | 0 refills | Status: DC

## 2023-10-04 LAB — CYCLIC CITRUL PEPTIDE ANTIBODY, IGG: Cyclic Citrullin Peptide Ab: <16 U

## 2023-10-04 LAB — ANTI-NUCLEAR AB-TITER (ANA TITER): ANA Titer 1: 1:40 {titer} — ABNORMAL HIGH

## 2023-10-04 LAB — ANGIOTENSIN CONVERTING ENZYME: Angiotensin-Converting Enzyme: 23 U/L (ref 9–67)

## 2023-10-04 LAB — PTH, INTACT AND CALCIUM
Calcium: 9.4 mg/dL (ref 8.6–10.3)
PTH: 29 pg/mL (ref 16–77)

## 2023-10-04 LAB — RHEUMATOID FACTOR: Rheumatoid fact SerPl-aCnc: <10 [IU]/mL (ref <14)

## 2023-10-04 LAB — CALCIUM, IONIZED: Calcium, Ion: 5.2 mg/dL (ref 4.7–5.5)

## 2023-10-04 LAB — ANA: Anti Nuclear Antibody (ANA): POSITIVE — AB

## 2023-10-05 LAB — ANTI-TPO AB (RDL): Anti-TPO Ab (RDL): 21.6 [IU]/mL — ABNORMAL HIGH (ref <9.0)

## 2023-10-07 ENCOUNTER — Encounter: Payer: Self-pay | Admitting: Family Medicine

## 2023-10-08 ENCOUNTER — Other Ambulatory Visit: Payer: Self-pay

## 2023-10-08 DIAGNOSIS — R768 Other specified abnormal immunological findings in serum: Secondary | ICD-10-CM

## 2023-11-12 NOTE — Progress Notes (Deleted)
 [facility] [address] [place] [address] Phone: [phone] Subjective:    I'm seeing this patient by the request  of:  Medicine, [facility] (Inactive)  CC: Right low back pain  [id number]  10/01/2023 Attempted injection of the back did not make any significant benefit.  May need to further image the hip.  Low back exam does have some mild arthritis we can see of the hip but very minimal.   Concerned treatment has not been extremely well at the moment.  Has had it initially and was on the hypothyroid medication but then has discontinued because his lab values were normal.  Patient does have a history of depression and significant stress.  Do feel that this could be contributing.  Will recheck labs to further evaluate.  May need to restart treatment.  Can be contributing to some of the discomfort as well.     Updated 11/13/2023 Eduardo [name] is a 53 y.o. male coming in with complaint of polyarthralgia, low back pain, positive ANA.  To have a autoimmune thyroiditis, significant elevation in uric acid, as well as low vitamin D .   Medications patient is on his once weekly vitamin D , levothyroxine  and duloxetine .    Past Medical History:  Diagnosis Date   Arthritis    GERD (gastroesophageal reflux disease)    Hypothyroidism    Past Surgical History:  Procedure Laterality Date   FOOT SURGERY Right    had accessory bone removed.   LUMBAR LAMINECTOMY/DECOMPRESSION MICRODISCECTOMY N/A 03/25/2019   Procedure: LUMBAR 2- LUMBAR 3 DECOMPRESSION;  Surgeon: Virl Grimes, MD;  Location: [facility] OR;  Service: Orthopedics;  Laterality: N/A;   Social History   Socioeconomic History   Marital status: Married    Spouse name: Not on file   Number of children: 0   Years of education: Not on file   Highest education level: Not on file  Occupational History   Not on file  Tobacco Use   Smoking status: Former    Current packs/day: 0.00     Types: Cigarettes    Quit date: 2007    Years since quitting: 18.4   Smokeless tobacco: Never  Vaping Use   Vaping status: Never Used  Substance and Sexual Activity   Alcohol use: Yes    Comment: occasionally   Drug use: No   Sexual activity: Yes  Other Topics Concern   Not on file  Social History Narrative   Not on file   Social Drivers of Health   Financial Resource Strain: Low Risk  (11/12/2023)   Received from [facility]   Overall Financial Resource Strain (CARDIA)    Difficulty of Paying Living Expenses: Not very hard  Food Insecurity: No Food Insecurity (11/12/2023)   Received from [facility]   Hunger Vital Sign    Within the past 12 months, you worried that your food would run out before you got the money to buy more.: Never true    Within the past 12 months, the food you bought just didn't last and you didn't have money to get more.: Never true  Transportation Needs: No Transportation Needs (11/12/2023)   Received from [facility] - Transportation    Lack of Transportation (Medical): No    Lack of Transportation (Non-Medical): No  Physical Activity: Insufficiently Active (11/12/2023)   Received from [facility]   Exercise Vital Sign    On average, how many days per week do  you engage in moderate to strenuous exercise (like a brisk walk)?: 2 days    On average, how many minutes do you engage in exercise at this level?: 30 min  Stress: Stress Concern Present (11/12/2023)   Received from [facility] of Occupational Health - Occupational Stress Questionnaire    Feeling of Stress : To some extent  Social Connections: Socially Integrated (11/12/2023)   Received from [facility]   Social Network    How would you rate your social network (family, work, friends)?: Good participation with social networks   No Known Allergies Family History  Adopted: Yes    Current Outpatient Medications (Endocrine & Metabolic):     levothyroxine  (SYNTHROID ) 50 MCG tablet, TAKE 1 TABLET(50 MCG) BY MOUTH DAILY BEFORE BREAKFAST   Semaglutide, 1 MG/DOSE, (OZEMPIC, 1 MG/DOSE,) 2 MG/1.5ML SOPN, Inject into the skin.  Current Outpatient Medications (Cardiovascular):    pravastatin  (PRAVACHOL ) 40 MG tablet, Take 1 tablet (40 mg total) by mouth daily.   Current Outpatient Medications (Analgesics):    Allopurinol  200 MG TABS, TAKE 1 TABLET BY MOUTH DAILY   diclofenac (VOLTAREN) 75 MG EC tablet, Take 75 mg by mouth 2 (two) times daily.   Current Outpatient Medications (Other):    clonazePAM (KLONOPIN) 1 MG disintegrating tablet, Take 1 mg by mouth 2 (two) times daily.   DULoxetine  40 MG CPEP, Take 1 capsule (40 mg total) by mouth daily.   methocarbamol (ROBAXIN) 750 MG tablet, Take 750 mg by mouth 4 (four) times daily.   Vitamin D , Ergocalciferol , (DRISDOL ) 1.25 MG (50000 UNIT) CAPS capsule, Take 1 capsule (50,000 Units total) by mouth every 7 (seven) days.   Reviewed prior external information including notes and imaging from  primary care provider As well as notes that were available from care everywhere and other healthcare systems.  Past medical history, social, surgical and family history all reviewed in electronic medical record.  No pertanent information unless stated regarding to the chief complaint.   Review of Systems:  No headache, visual changes, nausea, vomiting, diarrhea, constipation, dizziness, abdominal pain, skin rash, fevers, chills, night sweats, weight loss, swollen lymph nodes, body aches, joint swelling, chest pain, shortness of breath, mood changes. POSITIVE muscle aches  Objective  There were no vitals taken for this visit.   General: No apparent distress alert and oriented x3 mood and affect normal, dressed appropriately.  HEENT: Pupils equal, extraocular movements intact  Respiratory: Patient's speak in full sentences and does not appear short of breath  Cardiovascular: No lower extremity  edema, non tender, no erythema      Impression and Recommendations:     The above documentation has been reviewed and is accurate and complete Hamzah Savoca M Niajah Sipos, DO

## 2023-11-13 ENCOUNTER — Ambulatory Visit: Admitting: Family Medicine

## 2023-12-24 ENCOUNTER — Other Ambulatory Visit: Payer: Self-pay | Admitting: Family Medicine

## 2023-12-24 NOTE — Progress Notes (Unsigned)
 [name] [address] [place] [address] Phone: [phone] Subjective:   Eduardo [name], am serving as a scribe for Dr. Arthea Claudene.    CC: Back pain and allover pain follow-up  [name]  10/01/2023 Attempted injection of the back did not make any significant benefit.  May need to further image the hip.  Low back exam does have some mild arthritis we can see of the hip but very minimal.     Concerned treatment has not been extremely well at the moment.  Has had it initially and was on the hypothyroid medication but then has discontinued because his lab values were normal.  Patient does have a history of depression and significant stress.  Do feel that this could be contributing.  Will recheck labs to further evaluate.  May need to restart treatment.  Can be contributing to some of the discomfort as well.     Updated 12/26/2023 Eduardo [name] is a 53 y.o. male coming in with complaint of polyarthralgia. Follow up on labs and other items.  Patient states that he is feeling 100% better.  Injury and now for the autoimmune thyroid  disease.  Feels like a different person.  Significant improvement in strength as well as energy.  No pain in any of the joints or low back       Past Medical History:  Diagnosis Date   Arthritis    GERD (gastroesophageal reflux disease)    Hypothyroidism    Past Surgical History:  Procedure Laterality Date   FOOT SURGERY Right    had accessory bone removed.   LUMBAR LAMINECTOMY/DECOMPRESSION MICRODISCECTOMY N/A 03/25/2019   Procedure: LUMBAR 2- LUMBAR 3 DECOMPRESSION;  Surgeon: Beuford Anes, MD;  Location: [facility] OR;  Service: Orthopedics;  Laterality: N/A;   Social History   Socioeconomic History   Marital status: Married    Spouse name: Not on file   Number of children: 0   Years of education: Not on file   Highest education level: Not on file  Occupational History   Not on file  Tobacco Use    Smoking status: Former    Current packs/day: 0.00    Types: Cigarettes    Quit date: 2007    Years since quitting: 18.5   Smokeless tobacco: Never  Vaping Use   Vaping status: Never Used  Substance and Sexual Activity   Alcohol use: Yes    Comment: occasionally   Drug use: No   Sexual activity: Yes  Other Topics Concern   Not on file  Social History Narrative   Not on file   Social Drivers of Health   Financial Resource Strain: Low Risk  (11/12/2023)   Received from [facility]   Overall Financial Resource Strain (CARDIA)    Difficulty of Paying Living Expenses: Not very hard  Food Insecurity: No Food Insecurity (11/12/2023)   Received from [facility]   Hunger Vital Sign    Within the past 12 months, you worried that your food would run out before you got the money to buy more.: Never true    Within the past 12 months, the food you bought just didn't last and you didn't have money to get more.: Never true  Transportation Needs: No Transportation Needs (11/12/2023)   Received from [facility] - Transportation    Lack of Transportation (Medical): No    Lack of Transportation (Non-Medical): No  Physical Activity: Insufficiently Active (11/12/2023)   Received  from [facility]   Exercise Vital Sign    On average, how many days per week do you engage in moderate to strenuous exercise (like a brisk walk)?: 2 days    On average, how many minutes do you engage in exercise at this level?: 30 min  Stress: Stress Concern Present (11/12/2023)   Received from [facility] - Occupational Stress Questionnaire    Feeling of Stress : To some extent  Social Connections: Socially Integrated (11/12/2023)   Received from [facility]   Social Network    How would you rate your social network (family, work, friends)?: Good participation with social networks   No Known Allergies Family History  Adopted: Yes    Current Outpatient  Medications (Endocrine & Metabolic):    levothyroxine  (SYNTHROID ) 50 MCG tablet, TAKE 1 TABLET(50 MCG) BY MOUTH DAILY BEFORE BREAKFAST   Semaglutide, 1 MG/DOSE, (OZEMPIC, 1 MG/DOSE,) 2 MG/1.5ML SOPN, Inject into the skin.  Current Outpatient Medications (Cardiovascular):    pravastatin  (PRAVACHOL ) 40 MG tablet, Take 1 tablet (40 mg total) by mouth daily.   Current Outpatient Medications (Analgesics):    Allopurinol  200 MG TABS, TAKE 1 TABLET BY MOUTH DAILY   diclofenac (VOLTAREN) 75 MG EC tablet, Take 75 mg by mouth 2 (two) times daily.   Current Outpatient Medications (Other):    clonazePAM (KLONOPIN) 1 MG disintegrating tablet, Take 1 mg by mouth 2 (two) times daily.   DULoxetine  40 MG CPEP, Take 1 capsule (40 mg total) by mouth daily.   methocarbamol (ROBAXIN) 750 MG tablet, Take 750 mg by mouth 4 (four) times daily.   Vitamin D , Ergocalciferol , (DRISDOL ) 1.25 MG (50000 UNIT) CAPS capsule, TAKE 1 CAPSULE BY MOUTH EVERY 7 DAYS    Objective  Blood pressure 122/74, pulse 89, height 5' 10 (1.778 m), weight 261 lb (118.4 kg), SpO2 97%.   General: No apparent distress alert and oriented x3 mood and affect normal, dressed appropriately.  HEENT: Pupils equal, extraocular movements intact  Respiratory: Patient's speak in full sentences and does not appear short of breath       Impression and Recommendations:     The above documentation has been reviewed and is accurate and complete Gyselle Matthew M Gerre Ranum, DO

## 2023-12-26 ENCOUNTER — Ambulatory Visit: Admitting: Family Medicine

## 2023-12-26 VITALS — BP 122/74 | HR 89 | Ht 70.0 in | Wt 261.0 lb

## 2023-12-26 DIAGNOSIS — M5416 Radiculopathy, lumbar region: Secondary | ICD-10-CM | POA: Diagnosis not present

## 2023-12-26 DIAGNOSIS — E039 Hypothyroidism, unspecified: Secondary | ICD-10-CM

## 2023-12-26 NOTE — Assessment & Plan Note (Signed)
 No discomfort, and significant improvement in energy.  Follow-up as needed and will follow-up with endocrinologist

## 2023-12-26 NOTE — Assessment & Plan Note (Signed)
 Remarkably better at this point.  No pain at this moment.  Able to do activities without any significant difficulty.  Follow-up with me as needed

## 2024-01-01 ENCOUNTER — Other Ambulatory Visit: Payer: Self-pay | Admitting: Family Medicine

## 2024-01-05 ENCOUNTER — Other Ambulatory Visit: Payer: Self-pay | Admitting: Family Medicine

## 2024-03-17 ENCOUNTER — Other Ambulatory Visit: Payer: Self-pay | Admitting: Family Medicine

## 2024-06-08 ENCOUNTER — Other Ambulatory Visit: Payer: Self-pay | Admitting: Family Medicine
